# Patient Record
Sex: Female | Born: 1945 | Race: White | Hispanic: No | Marital: Single | Smoking: Never smoker
Health system: Southern US, Community
[De-identification: ages and names within clinical notes are randomized; demographics above are authoritative.]

## PROBLEM LIST (undated history)

## (undated) DIAGNOSIS — I10 Essential (primary) hypertension: Secondary | ICD-10-CM

## (undated) DIAGNOSIS — Z8739 Personal history of other diseases of the musculoskeletal system and connective tissue: Secondary | ICD-10-CM

## (undated) DIAGNOSIS — E119 Type 2 diabetes mellitus without complications: Secondary | ICD-10-CM

## (undated) DIAGNOSIS — E7800 Pure hypercholesterolemia, unspecified: Secondary | ICD-10-CM

## (undated) HISTORY — PX: APPENDECTOMY: SHX54

## (undated) HISTORY — PX: JOINT REPLACEMENT: SHX530

## (undated) HISTORY — PX: ABDOMINAL HYSTERECTOMY: SHX81

## (undated) HISTORY — PX: OTHER SURGICAL HISTORY: SHX169

## (undated) HISTORY — PX: EYE SURGERY: SHX253

---

## 2014-07-24 DIAGNOSIS — F4322 Adjustment disorder with anxiety: Secondary | ICD-10-CM | POA: Insufficient documentation

## 2014-07-24 DIAGNOSIS — N189 Chronic kidney disease, unspecified: Secondary | ICD-10-CM | POA: Insufficient documentation

## 2014-07-24 DIAGNOSIS — K589 Irritable bowel syndrome without diarrhea: Secondary | ICD-10-CM

## 2014-07-24 DIAGNOSIS — M159 Polyosteoarthritis, unspecified: Secondary | ICD-10-CM

## 2014-07-24 DIAGNOSIS — E039 Hypothyroidism, unspecified: Secondary | ICD-10-CM | POA: Insufficient documentation

## 2014-07-24 DIAGNOSIS — I10 Essential (primary) hypertension: Secondary | ICD-10-CM | POA: Insufficient documentation

## 2014-07-24 HISTORY — DX: Hypothyroidism, unspecified: E03.9

## 2014-07-24 HISTORY — DX: Irritable bowel syndrome, unspecified: K58.9

## 2014-07-24 HISTORY — DX: Adjustment disorder with anxiety: F43.22

## 2014-07-24 HISTORY — DX: Polyosteoarthritis, unspecified: M15.9

## 2014-07-24 HISTORY — DX: Chronic kidney disease, unspecified: N18.9

## 2015-01-23 DIAGNOSIS — E782 Mixed hyperlipidemia: Secondary | ICD-10-CM

## 2015-01-23 HISTORY — DX: Mixed hyperlipidemia: E78.2

## 2015-07-24 DIAGNOSIS — N183 Chronic kidney disease, stage 3 unspecified: Secondary | ICD-10-CM | POA: Insufficient documentation

## 2015-07-24 DIAGNOSIS — E1122 Type 2 diabetes mellitus with diabetic chronic kidney disease: Secondary | ICD-10-CM

## 2015-07-24 HISTORY — DX: Type 2 diabetes mellitus with diabetic chronic kidney disease: E11.22

## 2015-07-24 HISTORY — DX: Chronic kidney disease, stage 3 unspecified: N18.30

## 2016-07-26 DIAGNOSIS — M6740 Ganglion, unspecified site: Secondary | ICD-10-CM | POA: Insufficient documentation

## 2016-07-26 DIAGNOSIS — E042 Nontoxic multinodular goiter: Secondary | ICD-10-CM | POA: Insufficient documentation

## 2016-07-26 HISTORY — DX: Ganglion, unspecified site: M67.40

## 2016-07-26 HISTORY — DX: Nontoxic multinodular goiter: E04.2

## 2017-07-02 ENCOUNTER — Emergency Department (HOSPITAL_BASED_OUTPATIENT_CLINIC_OR_DEPARTMENT_OTHER): Payer: Medicare Other

## 2017-07-02 ENCOUNTER — Emergency Department (HOSPITAL_BASED_OUTPATIENT_CLINIC_OR_DEPARTMENT_OTHER)
Admission: EM | Admit: 2017-07-02 | Discharge: 2017-07-02 | Disposition: A | Discharge status: Home / Self Care | Payer: Medicare Other | Attending: Emergency Medicine | Admitting: Emergency Medicine

## 2017-07-02 ENCOUNTER — Encounter (HOSPITAL_BASED_OUTPATIENT_CLINIC_OR_DEPARTMENT_OTHER): Payer: Self-pay | Admitting: *Deleted

## 2017-07-02 DIAGNOSIS — E119 Type 2 diabetes mellitus without complications: Secondary | ICD-10-CM | POA: Insufficient documentation

## 2017-07-02 DIAGNOSIS — I10 Essential (primary) hypertension: Secondary | ICD-10-CM | POA: Diagnosis not present

## 2017-07-02 DIAGNOSIS — M549 Dorsalgia, unspecified: Secondary | ICD-10-CM

## 2017-07-02 DIAGNOSIS — M546 Pain in thoracic spine: Secondary | ICD-10-CM

## 2017-07-02 HISTORY — DX: Personal history of other diseases of the musculoskeletal system and connective tissue: Z87.39

## 2017-07-02 HISTORY — DX: Pure hypercholesterolemia, unspecified: E78.00

## 2017-07-02 HISTORY — DX: Essential (primary) hypertension: I10

## 2017-07-02 HISTORY — DX: Type 2 diabetes mellitus without complications: E11.9

## 2017-07-02 MED ORDER — LIDO-CAPSAICIN-MEN-METHYL SAL 4-0.0375-5-20 % EX PTCH: 1.0000 | MEDICATED_PATCH | CUTANEOUS | 0 refills | Status: DC

## 2017-07-02 MED ORDER — LIDOCAINE 5 % EX PTCH
1.0000 | MEDICATED_PATCH | CUTANEOUS | Status: DC
  Filled 2017-07-02: qty 1

## 2017-07-02 MED ORDER — OXYCODONE-ACETAMINOPHEN 5-325 MG PO TABS: 1.0000 | ORAL_TABLET | Freq: Four times a day (QID) | ORAL | 0 refills | Status: DC | PRN

## 2017-07-02 NOTE — ED Provider Notes (Signed)
[facility] Provider Note   CSN: [id number] Arrival date & time: 07/02/17  1002     History   Chief Complaint Chief Complaint  Patient presents with  . Back Pain    chronic    HPI Jenny Thompson is a tearful 71 y.o. female with a h/o of DDD and DM who presents to the emergency department with constant, worsening, non-radiating mid-back pain that she describes as achy and sharp. She reports a history of chronic back pain for which she sees a chiropractor once a month for maintenance. She states that typically after visit that she is pain-free; however, she saw her chiropractor 3 days ago with no relief. She reports associated intermittent nausea. Denies fever, chills, numbness, weakness, or tingling. She has treated her symptoms at home with ibuprofen and Excedrin, which will provide some relief for a short amount of time before the pain returns. She reports that she has been unable to sleep through the night due to the pain. She denies chest pain, dyspnea, abdominal pain, or emesis. No new falls or injuries.  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   The history is provided by the patient. No language interpreter was used.    Past Medical History:  Diagnosis Date  . Diabetes mellitus without complication (HCC)   . Hx of degenerative disc disease   . Hypercholesteremia   . Hypertension     There are no active problems to display for this patient.   Past Surgical History:  Procedure Laterality Date  . ABDOMINAL HYSTERECTOMY    . APPENDECTOMY    . arm fracture surgery Left   . EYE SURGERY    . JOINT REPLACEMENT      OB History    No data available       Home Medications    Prior to Admission medications   Medication Sig Start Date End Date Taking? Authorizing Provider  alendronate (FOSAMAX) 70 MG tablet Take 70 mg by mouth once a week. Take with a full glass of water on an empty stomach.   Yes [provider]    aspirin EC 81 MG tablet Take 81 mg by mouth daily.   Yes [provider]  buPROPion (ZYBAN) 150 MG 12 hr tablet Take 150 mg by mouth 2 (two) times daily.   Yes [provider]  calcium-vitamin D 250-100 MG-UNIT tablet Take 1 tablet by mouth 2 (two) times daily.   Yes [provider]  cholecalciferol (VITAMIN D) 1000 units tablet Take 1,000 Units by mouth daily.   Yes [provider]  clidinium-chlordiazePOXIDE (LIBRAX) 5-2.5 MG capsule Take 1 capsule by mouth as needed.   Yes [provider]  hydrochlorothiazide (HYDRODIURIL) 25 MG tablet Take 25 mg by mouth daily.   Yes [provider]  metFORMIN (GLUCOPHAGE) 500 MG tablet Take 500 mg by mouth 2 (two) times daily with a meal.   Yes [provider]  methocarbamol (ROBAXIN) 750 MG tablet Take 750 mg by mouth as needed for muscle spasms.   Yes [provider]  Multiple Vitamin (MULTIVITAMIN) tablet Take 1 tablet by mouth daily.   Yes [provider]  ramipril (ALTACE) 10 MG capsule Take 10 mg by mouth daily.   Yes [provider]  rosuvastatin (CRESTOR) 20 MG tablet Take 20 mg by mouth daily.   Yes [provider]  Lido-Capsaicin-Men-Methyl Sal (1ST MEDX-PATCH/ LIDOCAINE) 4-0.374-05-08 % [facility] Apply 1 patch topically daily. 07/02/17  Dawanda Mapel A, PA-C  oxyCODONE-acetaminophen (PERCOCET/ROXICET) 5-325 MG tablet Take 1 tablet by mouth every 6 (six) hours as needed for severe pain. 07/02/17   Yaretzy Olazabal A, PA-C    Family History No family history on file.  Social History Social History  Substance Use Topics  . Smoking status: Never Smoker  . Smokeless tobacco: Never Used  . Alcohol use No     Allergies   Prednisone   Review of Systems Review of Systems  Constitutional: Negative for chills and fever.  Respiratory: Negative for shortness of breath.   Cardiovascular: Negative for chest pain.  Gastrointestinal: Positive for nausea.  Negative for abdominal pain, diarrhea and vomiting.  Musculoskeletal: Positive for back pain and myalgias. Negative for gait problem, joint swelling, neck pain and neck stiffness.  Skin: Negative for rash and wound.  Neurological: Negative for dizziness, weakness, light-headedness, numbness and headaches.     Physical Exam Updated Vital Signs BP 135/63 (BP Location: Right Arm)   Pulse 87   Temp 97.9 F (36.6 C) (Oral)   Resp 18   Ht 5\' 1"  (1.549 m)   Wt 80.3 kg (177 lb)   SpO2 97%   BMI 33.44 kg/m   Physical Exam  Constitutional: No distress.  HENT:  Head: Normocephalic.  Eyes: Conjunctivae are normal.  Neck: Neck supple.  Cardiovascular: Normal rate, regular rhythm and intact distal pulses.  Exam reveals no gallop and no friction rub.   Murmur heard. Pulmonary/Chest: Effort normal and breath sounds normal. No respiratory distress. She has no wheezes. She has no rales.  Abdominal: Soft. She exhibits no distension. There is no tenderness.  Musculoskeletal:  Tenderness to palpation over the bony midline of the thoracic spine with mild surrounding tenderness to the left paraspinal muscles of the thoracic spine. No right-sided paraspinal muscle tenderness. No cervical or lumbar tenderness to the spinous processes or the surrounding paraspinal muscles. Radial pulses 2+ bilaterally. 5 out of 5 grip strength of the bilateral upper extremities. Sensation is intact throughout.  Neurological: She is alert.  Skin: Skin is warm. No rash noted.  Psychiatric: Her behavior is normal.  Nursing note and vitals reviewed.    ED Treatments / Results  Labs (all labs ordered are listed, but only abnormal results are displayed) Labs Reviewed - No data to display  EKG  EKG Interpretation None       Radiology Dg Thoracic Spine W/swimmers  Result Date: 07/02/2017 CLINICAL DATA:  Chronic upper thoracic back pain EXAM: THORACIC SPINE - 3 VIEWS COMPARISON:  None available FINDINGS: Severe  degenerative spondylosis of the thoracic spine at all levels with disc space narrowing, sclerosis and endplate osteophytes. Normal alignment. No acute compression fracture, wedge-shaped deformity or focal kyphosis. Normal paraspinal soft tissues. Atherosclerosis of aorta. Trachea is midline. Right hemidiaphragm is elevated. 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 No acute finding by plain radiography Electronically Signed   By: Judie Petit.  Shick M.D.   On: 07/02/2017 11:12    Procedures Procedures (including critical care time)  Medications Ordered in ED Medications - No data to display   Initial Impression / Assessment and Plan / ED Course  I have reviewed the triage vital signs and the nursing notes.  Pertinent labs & imaging results that were available during my care of the patient were reviewed by me and considered in my medical decision making (see chart for details).     71 year old female with a history of avascular necrosis of the left shoulder  and DDD presenting with acute on chronic atraumatic tenderness to palpation of the thoracic spine. The patient was seen and evaluated with Dr. Rush Landmarkegeler, attending physician. Imaging reveals severe multilevel degenerative spondylosis of the thoracic spine. No acute findings. Normal neuro exam without focal deficits. Will discharge the patient with lidocaine patches and a short course of pain medication and follow-up to her PCP. A 7745-month prescription history query was performed using the [place] CSRS prior to discharge. Strict return precautions given. No acute distress. The patient is safe and stable for discharge at this time.  Final Clinical Impressions(s) / ED Diagnoses   Final diagnoses:  Back pain  Acute midline thoracic back pain    New Prescriptions New Prescriptions   LIDO-CAPSAICIN-MEN-METHYL SAL (1ST MEDX-PATCH/ LIDOCAINE) 4-0.374-05-08 % [facility]    Apply 1 patch topically daily.   OXYCODONE-ACETAMINOPHEN  (PERCOCET/ROXICET) 5-325 MG TABLET    Take 1 tablet by mouth every 6 (six) hours as needed for severe pain.     Barkley BoardsMcDonald, Zaia Carre A, PA-C 07/02/17 1202    Tegeler, Canary Brimhristopher J, MD 07/05/17 1024

## 2017-07-02 NOTE — ED Triage Notes (Signed)
Pt reports chronic neck and upper back pain. States she's seen regularly by a chiropractor (last visit this past Thursday). Reports unrelieved pain; reports no pain meds PTA. Pt denies chest pain, sob, incontinence of bowel/bladder.

## 2017-07-02 NOTE — Discharge Instructions (Signed)
Please follow-up with your primary care provider if symptoms persist. If you develop new or worsening symptoms including fever, chills, numbness, weakness, or other new severe symptoms, please return to the emergency department for reevaluation.

## 2017-07-02 NOTE — ED Notes (Signed)
ED Provider at bedside. 

## 2017-07-27 DIAGNOSIS — M7502 Adhesive capsulitis of left shoulder: Secondary | ICD-10-CM | POA: Insufficient documentation

## 2017-07-27 DIAGNOSIS — M47814 Spondylosis without myelopathy or radiculopathy, thoracic region: Secondary | ICD-10-CM

## 2017-07-27 HISTORY — DX: Spondylosis without myelopathy or radiculopathy, thoracic region: M47.814

## 2017-07-27 HISTORY — DX: Adhesive capsulitis of left shoulder: M75.02

## 2018-10-27 ENCOUNTER — Ambulatory Visit (INDEPENDENT_AMBULATORY_CARE_PROVIDER_SITE_OTHER): Payer: Medicare Other | Admitting: Psychiatry

## 2018-10-27 DIAGNOSIS — Z8659 Personal history of other mental and behavioral disorders: Secondary | ICD-10-CM | POA: Insufficient documentation

## 2018-10-27 DIAGNOSIS — F4322 Adjustment disorder with anxiety: Secondary | ICD-10-CM

## 2018-10-27 HISTORY — DX: Personal history of other mental and behavioral disorders: Z86.59
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
## 2019-01-01 ENCOUNTER — Ambulatory Visit (INDEPENDENT_AMBULATORY_CARE_PROVIDER_SITE_OTHER): Payer: Medicare Other | Admitting: Psychiatry

## 2019-01-01 DIAGNOSIS — Z8659 Personal history of other mental and behavioral disorders: Secondary | ICD-10-CM

## 2019-01-01 DIAGNOSIS — F4322 Adjustment disorder with anxiety: Secondary | ICD-10-CM

## 2019-01-01 NOTE — Progress Notes (Addendum)
Psychotherapy Progress Note [facility]  Patient ID: Jenny Thompson     MRN: [id number]      Date:      Start: 1:19p Stop: 1:10p Time Spent: 51 min Therapy format: Individual psychotherapy  Session narrative -- presenting needs, interim history, self-report of stressors and symptoms, applications of prior therapy, status changes, and interventions in session Frustrations and sympathetic grief for sister dealing with Jenny Thompson's dementia.  Social dynamics can be tense visiting them, and medical testing for him has been particularly frustrating.  Off-putting visit from virtual niece Jenny Thompson, who seemed entitled/expecting attention, reactionary, and drove Jenny Thompson to tears twice in the first hour.  Jenny Thompson's driving nerve-wracking, and Jenny Thompson's attention soft about navigating, setting him up for sudden reactions of his own.  Had practice not giving in for another of Jenny Thompson's self-serving plans (would have compromised travel).  Also had back trouble flare-up, enough to need out-of-town chiropractor.  Big decision to make about annual Jenny Thompson trip with sister for her birthday, knowing Jenny Thompson is increasingly unable to handle himself by himself.  (ADLs deteriorated enough now that he has not showered or shaved in 2 weeks, and chronically wets himself.)    Sees Jenny Thompson talking about symptoms but not doing anything significant.  Consulted about how to approach Jenny Thompson with worries -- largely, ask for time, present the discrepancy between what she says and does, ask if she sees it too, then ask if she wants it different.  Then would be the opportunity to go into solutions, but the first thing is perception and motivation.  Agrees.  Therapeutic modalities: Cognitive Behavioral Therapy, Assertiveness/Communication and Solution-Oriented/Positive Psychology  Mental Status/Observations:  Appearance:   Casual     Behavior:  Appropriate  Motor:  Normal  Speech/Language:   Clear and Coherent  Affect:  Appropriate  Mood:   appropriate to situation  Thought process:  normal  Thought content:    WNL  Sensory/Perceptual disturbances:    WNL  Orientation:  WNL  Attention:  Good  Concentration:  Good  Memory:  WNL  Insight:    Good  Judgment:   Good  Impulse Control:  Good   Risk Assessment: Danger to Self:  No Self-injurious Behavior: No Danger to Others: No Duty to Warn:no Physical Aggression / Violence:No  Access to Firearms a concern: No   Diagnosis:   ICD-10-CM   1. 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 Continued freedom to assert and supportively inquire with sister . Use tips on broaching the caring conversation ad lib . Continue to utilize previously learned skills ad lib . Maintain medication, if prescribed, and work faithfully with relevant prescriber(s) . Call the clinic on-call service, present to ER, or call 911 if any life-threatening emergency . Follow up with me in about 2 months or as desired if things change    Robley Fries, PhD [place] Licensed Psychologist

## 2019-02-14 ENCOUNTER — Ambulatory Visit: Payer: Medicare Other | Admitting: Psychiatry

## 2019-02-28 ENCOUNTER — Ambulatory Visit (INDEPENDENT_AMBULATORY_CARE_PROVIDER_SITE_OTHER): Payer: Medicare Other | Admitting: Psychiatry

## 2019-02-28 ENCOUNTER — Other Ambulatory Visit: Payer: Self-pay

## 2019-02-28 DIAGNOSIS — F4322 Adjustment disorder with anxiety: Secondary | ICD-10-CM

## 2019-02-28 DIAGNOSIS — E119 Type 2 diabetes mellitus without complications: Secondary | ICD-10-CM | POA: Diagnosis not present

## 2019-02-28 DIAGNOSIS — Z8659 Personal history of other mental and behavioral disorders: Secondary | ICD-10-CM

## 2019-02-28 NOTE — Progress Notes (Signed)
Psychotherapy Progress Note [facility] 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  Tapering bupropion and tapering on Cymbalta, having been sleeping badly since Xmas (initial insomnia).  Has tried the amber glasses, using over a year, and has sleep mask.  Gabapentin for pain was not reliable.  Now sleeping soundly with Cymbalta, successfully dosed at night.  Also Flonase at night, to treat bedtime coughing that also kept her up.  Air cleaner runs continuously in bedroom.  Glad to have better sleep, all else is easier with it.  Saw [name] and [name], in [place], for her birthday, including [name], without incident.  No interventions made for the Xmas incident, seems to just be an attitude turned without intervening.  Just turned out that merely accepting her, without crossing her, probably calmed her rejection issue.  PT and sister did realize Xmas that [name] needed support s/p seeing her homophobic parents.  Re. worries for Ria Comment is doing all driving now for husband with dementia.  Glad to have helped her get out from under the daily pressure and glad to have more immediate time with her.  Less feeling that PT is swallowing things she wants to say than just exercising discretion.  [name] took suggestion from PT to have husband write down priority things for memory's sake.  PT's anxiety about sister's situation down a great deal, more routine now not  to have to fix it, just hear it.  Feels now she has conquered the impulse, able to let it be a feeling.    Otherwise, pleased with housekeeping, much less cluttered and procrastinated than before.   Discussed outlook for therapy -- not ready emotionally to call off sessions, still wants some confidence boost, but OK with less frequent, e.g., q 110mo.  Agreed.  Therapeutic modalities: Solution-Oriented/Positive Psychology and Ego-Supportive  Mental Status/Observations:  Appearance:   Casual     Behavior:  Appropriate  Motor:  Normal  Speech/Language:   Clear and Coherent  Affect:  Appropriate and pleasant  Mood:  euthymic  Thought process:  normal  Thought content:    WNL  Sensory/Perceptual disturbances:    WNL  Orientation:  WNL  Attention:  Good  Concentration:  Good  Memory:  WNL  Insight:    Good  Judgment:   Good  Impulse Control:  Good   Risk Assessment: Danger to Self:  No Self-injurious Behavior: No Danger to Others: No Duty to Warn:no Physical Aggression / Violence:No  Access to Firearms a concern: No   Diagnosis:   ICD-10-CM   1. 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 Type 2 diabetes mellitus without complication, unspecified whether long term insulin use (HCC) E11.9     Assessment of progress:  improving  Plan:  . Continue serene approach to sister's troubles and quickness to let go impulses to fix . Continue to apply humane treatment of self  and others  . Continue positive medical care  . Continue to utilize previously learned skills ad lib . Maintain medication as prescribed and work faithfully with relevant prescriber(s) if any changes are desired or seem indicated . Call the clinic on-call service, present to ER, or call 911 if any life-threatening emergency Return in about 2 months (around 04/30/2019).   Robley Fries, PhD [place] Licensed Psychologist

## 2019-05-01 ENCOUNTER — Ambulatory Visit: Payer: Medicare Other | Admitting: Psychiatry

## 2021-10-07 ENCOUNTER — Ambulatory Visit (INDEPENDENT_AMBULATORY_CARE_PROVIDER_SITE_OTHER): Payer: Medicare Other | Admitting: Psychiatry

## 2021-10-07 ENCOUNTER — Other Ambulatory Visit: Payer: Self-pay

## 2021-10-07 DIAGNOSIS — F4323 Adjustment disorder with mixed anxiety and depressed mood: Secondary | ICD-10-CM
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
## 2021-11-18 ENCOUNTER — Ambulatory Visit (INDEPENDENT_AMBULATORY_CARE_PROVIDER_SITE_OTHER): Payer: Medicare Other | Admitting: Psychiatry

## 2021-11-18 ENCOUNTER — Other Ambulatory Visit: Payer: Self-pay

## 2021-11-18 DIAGNOSIS — Z636 Dependent relative needing care at home: Secondary | ICD-10-CM

## 2021-11-18 DIAGNOSIS — F341 Dysthymic disorder: Secondary | ICD-10-CM | POA: Diagnosis not present

## 2021-11-18 NOTE — Progress Notes (Signed)
Psychotherapy Progress Note [facility] Marliss Czar, PhD LP  Patient ID: Kaydynce Pat [facility])    MRN: [id number] Therapy format: Individual psychotherapy Date: 11/18/2021      Start: 9:13a     Stop: 10:03a     Time Spent: 50 min Location: In-person   Session narrative (presenting needs, interim history, self-report of stressors and symptoms, applications of prior therapy, status changes, and interventions made in session) Approaching annual holiday trip to [place] for [name] BD and Xmas.  Apprehensions facing things that may be painful there, including seeing sister's needs/suffering up close again, tom's continuing decline into dementia with [name] as almost sole caregiver, and the distasteful scenario of [name] sister Gunnar Fusi getting taken advantage of by a scamming caregiver and how it works on [place].    More than that, wants to deal with her tendency to isolate.  Mostly doesn't want to see people and is comfortable with that.  Continued suggestion she has seen enough hurtful happen in life, or at least enough draining by others, remains leery of getting too involved absent a strong trust or family duty.    Still indulges long phone calls from [place] on a regular basis, largely listening 2 hours to obsessing about things.  Incrementally maybe getting her to reach out for other supports where she is.    More aware of her own mortality lately.  Clear she has made it living after losing her parents, when she used to think she couldn't abide it.  10 years of a nagging cough variously attributed to postnasal drip or GERD, but sparse findings on endoscopy.  Has had esophageal stretch and thyroid biopsy (long hx of nodules).  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  Some finding on ECG that she has dropped beats, and she has had some murmur already.  Mostly has done well taking care of diabetes, HTN.  Worries for [name] with her back  condition, sometimes dysfunctional need for privacy, tendency to lean only on Evalynn, slow adoption of in-home help for [name].    Supportively discussed tactics she can use and the affirmative, compassionate rationale for nudging her to try out other supports more.  Modeled assertiveness and matter-of-factness in acknowledging both her own interests (e.g., some time back, protection from compassion/fatigue) and [name] (head off a painful collision with reality if Shandra dies first).  Affirmed plenty of good and understandable reasons, nothing immoral about wanting those things.  Aside from so much life tied up with [name] and her situations, remains involved with her church, although the current upheaval in the [facility] is directly affecting her, as she is in the distinct minority who did not want to disaffiliate (roughly 6:1 vote to leave).  Therapeutic modalities: Cognitive Behavioral Therapy, Solution-Oriented/Positive Psychology, Ego-Supportive, and Humanistic/Existential  Mental Status/Observations:  Appearance:   Casual     Behavior:  Appropriate  Motor:  Normal  Speech/Language:   Clear and Coherent  Affect:  Appropriate  Mood:  dysthymic  Thought process:  normal  Thought content:    WNL  Sensory/Perceptual disturbances:    WNL  Orientation:  Fully oriented  Attention:  Good    Concentration:  Good  Memory:  WNL  Insight:    Good  Judgment:   Good  Impulse Control:  Good   Risk Assessment: Danger to Self: No Self-injurious Behavior: No Danger to Others: No Physical Aggression / Violence: No Duty to Warn: No Access to  Firearms a concern: No  Assessment of progress:  progressing  Diagnosis:   ICD-10-CM   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 Caregiver stress  Z63.6      Plan:  Self-affirm good reasons and mutual interest lie behind efforts to nudge [name] to connect to other resources, be willing to hire services even at some cost to privacy and appearances, and to spread out her  dependency Continue to apply free choice to service rendered -- no "have tos", only "choose tos" for good enough reasons Lead, where needed, in acknowledging existential reality that neither of them have forever, and how deeper love looks ahead and starts adjusting to change before it drops abruptly Other recommendations/advice as may be noted above Continue to utilize previously learned skills ad lib Maintain medication as prescribed and work faithfully with relevant prescriber(s) if any changes are desired or seem indicated Call the clinic on-call service, 988/hotline, 911, or present to [facility] or ER if any life-threatening psychiatric crisis Return for time at discretion. Already scheduled visit in this office Visit date not found.  Robley Fries, PhD Marliss Czar, PhD LP Clinical Psychologist, [facility] [address] [place], [place] [address] [phone]

## 2022-01-08 ENCOUNTER — Ambulatory Visit (INDEPENDENT_AMBULATORY_CARE_PROVIDER_SITE_OTHER): Payer: Medicare Other | Admitting: Psychiatry

## 2022-01-08 ENCOUNTER — Other Ambulatory Visit: Payer: Self-pay

## 2022-01-08 DIAGNOSIS — Z636 Dependent relative needing care at home: Secondary | ICD-10-CM | POA: Diagnosis not present

## 2022-01-08 DIAGNOSIS — F4323 Adjustment disorder with mixed anxiety and depressed mood: Secondary | ICD-10-CM

## 2022-01-08 DIAGNOSIS — F341 Dysthymic disorder: Secondary | ICD-10-CM

## 2022-01-08 NOTE — Progress Notes (Unsigned)
Psychotherapy Progress Note [facility] Marliss Czar, PhD LP  Patient ID: Jenny Thompson [facility])    MRN: [id number] Therapy format: {Therapy Types:21967::"Individual psychotherapy"} Date: 01/08/2022      Start: ***:***     Stop: ***:***     Time Spent: *** min Location: {SvcLoc:22530::"In-person"}   Session narrative (presenting needs, interim history, self-report of stressors and symptoms, applications of prior therapy, status changes, and interventions made in session) Went to [facility] for 3 weeks, stayed 6.  Jenny Thompson had back procedure.  Seeing Jenny Thompson succumb to a career now of continuous drinking and smoking nightly, "nightmare" seeing her mistake directions and be so compulsive.  Prep to deal with irritating family turned out not to be needed, as they begged off, but she was centered enough not to consider herself controlled by their manipulation.  Hard to see Jenny Thompson's (BIL) dementia deepening.  Jenny Thompson will most likely have further surgery in several weeks, which will requite 3-4 weeks off duty waiting on Jenny Thompson.    Physically, more active in [place], keeping it up home.    Therapeutic modalities: {AM:23362::"Cognitive Behavioral Therapy","Solution-Oriented/Positive Psychology"}  Mental Status/Observations:  Appearance:   {PSY:22683}     Behavior:  {PSY:21022743}  Motor:  {PSY:22302}  Speech/Language:   {PSY:22685}  Affect:  {PSY:22687}  Mood:  {PSY:31886}  Thought process:  {PSY:31888}  Thought content:    {PSY:469 477 5470}  Sensory/Perceptual disturbances:    {PSY:313-545-7224}  Orientation:  {Psych Orientation:23301::"Fully oriented"}  Attention:  {Good-Fair-Poor ratings:23770::"Good"}    Concentration:  {Good-Fair-Poor ratings:23770::"Good"}  Memory:  {PSY:401 034 5201}  Insight:    {Good-Fair-Poor ratings:23770::"Good"}  Judgment:   {Good-Fair-Poor ratings:23770::"Good"}  Impulse Control:  {Good-Fair-Poor ratings:23770::"Good"}   Risk Assessment: Danger to Self:  {Risk:22599::"No"} Self-injurious Behavior: {Risk:22599::"No"} Danger to Others: {Risk:22599::"No"} Physical Aggression / Violence: {Risk:22599::"No"} Duty to Warn: {AMYesNo:22526::"No"} Access to Firearms a concern: {AMYesNo:22526::"No"}  Assessment of progress:  {Progress:22147::"progressing"}  Diagnosis: No diagnosis found. Plan:  *** Other recommendations/advice as may be noted above Continue to utilize previously learned skills ad lib Maintain medication as prescribed and work faithfully with relevant prescriber(s) if any changes are desired or seem indicated Call the clinic on-call service, 988/hotline, 911, or present to [facility] or ER if any life-threatening psychiatric crisis No follow-ups on file. Already scheduled visit in this office 02/08/2022.  Robley Fries, PhD Marliss Czar, PhD LP Clinical Psychologist, [facility] [address] [place], [place] [address] [phone]

## 2022-02-08 ENCOUNTER — Ambulatory Visit: Payer: Medicare Other | Admitting: Psychiatry

## 2022-03-11 ENCOUNTER — Other Ambulatory Visit: Payer: Self-pay

## 2022-03-11 ENCOUNTER — Ambulatory Visit (INDEPENDENT_AMBULATORY_CARE_PROVIDER_SITE_OTHER): Payer: Medicare Other | Admitting: Psychiatry

## 2022-03-11 DIAGNOSIS — F4323 Adjustment disorder with mixed anxiety and depressed mood: Secondary | ICD-10-CM | POA: Diagnosis not present

## 2022-03-11 DIAGNOSIS — Z636 Dependent relative needing care at home: Secondary | ICD-10-CM | POA: Diagnosis not present

## 2022-03-11 DIAGNOSIS — F341 Dysthymic disorder: Secondary | ICD-10-CM
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
## 2022-04-07 ENCOUNTER — Ambulatory Visit (INDEPENDENT_AMBULATORY_CARE_PROVIDER_SITE_OTHER): Payer: Medicare Other | Admitting: Psychiatry

## 2022-04-07 DIAGNOSIS — F341 Dysthymic disorder: Secondary | ICD-10-CM | POA: Diagnosis not present

## 2022-04-07 DIAGNOSIS — Z636 Dependent relative needing care at home: Secondary | ICD-10-CM

## 2022-04-07 DIAGNOSIS — F4323 Adjustment disorder with mixed anxiety and depressed mood: Secondary | ICD-10-CM

## 2022-04-07 NOTE — Progress Notes (Signed)
Psychotherapy Progress Note [facility] Luan Moore, PhD LP  Patient ID: Jenny Thompson [facility])    MRN: [id number] Therapy format: Individual psychotherapy Date: 04/07/2022      Start: 2:20p     Stop: 3:08p     Time Spent: 48 min Location: In-person   Session narrative (presenting needs, interim history, self-report of stressors and symptoms, applications of prior therapy, status changes, and interventions made in session) Headed to [facility] to help sister, who has now deferred surgery.  Still bothered by her unhealthy boundaries and unassertiveness with the family of her housekeeper, who are also supposedly buying the property in a seller-financed arrangement Akeria feels is poor judgment.  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  Figuring out now to inform other trusted people about her final wishes.  A true joy for Nica to have adopted neighbor family, highly trusted, and be regarded as a grandmother.  Affirmed local connections and discussed gently assertive tactics for issues with [name], including when she complains repetitively about things she won't do anything about.  Still takes nightly calls from [place] after she's been drinking into the evening.  Therapeutic modalities: Cognitive Behavioral Therapy, Solution-Oriented/Positive Psychology, and Ego-Supportive  Mental Status/Observations:  Appearance:   Casual     Behavior:  Appropriate  Motor:  Normal  Speech/Language:   Clear and Coherent  Affect:  Appropriate  Mood:  concerned  Thought process:  normal  Thought content:    WNL  Sensory/Perceptual disturbances:    WNL  Orientation:  Fully oriented  Attention:  Good    Concentration:  Good  Memory:  WNL  Insight:    Good  Judgment:   Good  Impulse Control:  Good   Risk Assessment: Danger to Self: No Self-injurious Behavior: No Danger to Others: No Physical Aggression / Violence: No Duty to Warn: No Access to Firearms a  concern: No  Assessment of progress:  progressing  Diagnosis:   ICD-10-CM   1. Adjustment disorder with mixed anxiety and depressed mood  F43.23     2. Caregiver stress  Z63.6     3. 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  Can recommend B complex to offset some of the damage, and supportively inquire after her self-care, including counseling. Self-affirm full right to decide which burdens to take on, both while in [place] and on nightly calls While visiting, full authority to enforce privacy  At discretion, may call in local Adult Protective Services at any time if she feels either [name] or [name] welfare are at risk Maintain helpful social support at home, including as desired neighbors, church, others Other recommendations/advice as may be noted above Continue to utilize previously learned skills ad lib Maintain medication as prescribed and work faithfully with relevant prescriber(s) if any changes are desired or seem indicated Call the clinic on-call service, 988/hotline, 911, or present to [facility] or ER if any life-threatening psychiatric crisis No follow-ups on file. Already scheduled visit in this office 05/07/2022.  [name], PhD Luan Moore, PhD LP Clinical Psychologist, [facility] [address], [place], [place] [address] [phone]

## 2022-05-07 ENCOUNTER — Ambulatory Visit (INDEPENDENT_AMBULATORY_CARE_PROVIDER_SITE_OTHER): Payer: Medicare Other | Admitting: Psychiatry

## 2022-05-07 DIAGNOSIS — F341 Dysthymic disorder: Secondary | ICD-10-CM

## 2022-05-07 DIAGNOSIS — Z636 Dependent relative needing care at home: Secondary | ICD-10-CM | POA: Diagnosis not present

## 2022-05-07 DIAGNOSIS — F4323 Adjustment disorder with mixed anxiety and depressed mood: Secondary | ICD-10-CM | POA: Diagnosis not present

## 2022-05-07 NOTE — Progress Notes (Signed)
Psychotherapy Progress Note [facility] Marliss Czar, PhD LP  Patient ID: Jenny Thompson [facility])    MRN: [id number] Therapy format: Individual psychotherapy Date: 05/07/2022      Start: 2:20p     Stop: 3:05p     Time Spent: 45 min Location: In-person   Session narrative (presenting needs, interim history, self-report of stressors and symptoms, applications of prior therapy, status changes, and interventions made in session) Back from [name], successfully got through pictures and cleaned out the room/structure where Annakate usually stays, in prep for selling the house.  Serendipitous contact with [name] first H and opportunity to share pictures.  Yet to deal with [name] getting back surgery, more likely will have nerve ablation June.  Meanwhile, continuing concerns for [name] nightly intoxication and daily all-or-none, workaholic ways, figures she's becoming better known in her small town as alcoholic.  Can't get her to reckon with alcoholism, but did order her B complex and got her to take it.  Affirmed and encouraged.  Further discussion of [name] handling of H's dementia and boundaries with housekeeper/buyer family.  Therapeutic modalities: Cognitive Behavioral Therapy, Solution-Oriented/Positive Psychology, and Ego-Supportive  Mental Status/Observations:  Appearance:   Casual     Behavior:  Appropriate  Motor:  Normal  Speech/Language:   Clear and Coherent  Affect:  Appropriate  Mood:  concerned  Thought process:  normal  Thought content:    WNL  Sensory/Perceptual disturbances:    WNL  Orientation:  Fully oriented  Attention:  Good    Concentration:  Good  Memory:  WNL  Insight:    Good  Judgment:   Good  Impulse Control:  Good   Risk Assessment: Danger to Self: No Self-injurious Behavior: No Danger to Others: No Physical Aggression / Violence: No Duty to Warn: No Access to Firearms a concern: No  Assessment of progress:  progressing  Diagnosis:    ICD-10-CM   1. Adjustment disorder with mixed anxiety and depressed mood  F43.23     2. 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  Can recommend B complex to offset some of the damage, and supportively inquire after her self-care, including counseling. Self-affirm full right to decide which burdens to take on, both while in [place] and on nightly calls While visiting, full authority to enforce privacy  At discretion, may call in local Adult Protective Services at any time if she feels either [name] or [name] welfare are at risk Maintain helpful social support at home, including as desired neighbors, church, others Other recommendations/advice as may be noted above Continue to utilize previously learned skills ad lib Maintain medication as prescribed and work faithfully with relevant prescriber(s) if any changes are desired or seem indicated Call the clinic on-call service, 988/hotline, 911, or present to [facility] or ER if any life-threatening psychiatric crisis Return for time as available. Already scheduled visit in this office 06/08/2022.  Robley Fries, PhD Marliss Czar, PhD LP Clinical Psychologist, [facility] [address] [place], [place] [address] [phone]

## 2022-06-05 ENCOUNTER — Emergency Department (HOSPITAL_BASED_OUTPATIENT_CLINIC_OR_DEPARTMENT_OTHER): Payer: Medicare Other

## 2022-06-05 ENCOUNTER — Encounter (HOSPITAL_BASED_OUTPATIENT_CLINIC_OR_DEPARTMENT_OTHER): Payer: Self-pay | Admitting: Emergency Medicine

## 2022-06-05 ENCOUNTER — Emergency Department (HOSPITAL_BASED_OUTPATIENT_CLINIC_OR_DEPARTMENT_OTHER)
Admission: EM | Admit: 2022-06-05 | Discharge: 2022-06-05 | Disposition: A | Discharge status: Home / Self Care | Payer: Medicare Other | Attending: Emergency Medicine | Admitting: Emergency Medicine

## 2022-06-05 DIAGNOSIS — Z20822 Contact with and (suspected) exposure to covid-19: Secondary | ICD-10-CM | POA: Diagnosis not present

## 2022-06-05 DIAGNOSIS — R062 Wheezing: Secondary | ICD-10-CM | POA: Insufficient documentation

## 2022-06-05 DIAGNOSIS — I10 Essential (primary) hypertension: Secondary | ICD-10-CM | POA: Insufficient documentation

## 2022-06-05 DIAGNOSIS — J208 Acute bronchitis due to other specified organisms: Secondary | ICD-10-CM

## 2022-06-05 DIAGNOSIS — R0981 Nasal congestion: Secondary | ICD-10-CM | POA: Insufficient documentation

## 2022-06-05 DIAGNOSIS — E119 Type 2 diabetes mellitus without complications: Secondary | ICD-10-CM | POA: Diagnosis not present

## 2022-06-05 DIAGNOSIS — Z7982 Long term (current) use of aspirin: Secondary | ICD-10-CM | POA: Diagnosis not present

## 2022-06-05 DIAGNOSIS — J069 Acute upper respiratory infection, unspecified: Secondary | ICD-10-CM | POA: Diagnosis not present

## 2022-06-05 DIAGNOSIS — Z79899 Other long term (current) drug therapy: Secondary | ICD-10-CM | POA: Diagnosis not present

## 2022-06-05 DIAGNOSIS — J209 Acute bronchitis, unspecified: Secondary | ICD-10-CM | POA: Diagnosis not present

## 2022-06-05 DIAGNOSIS — R059 Cough, unspecified: Secondary | ICD-10-CM | POA: Diagnosis present

## 2022-06-05 LAB — SARS CORONAVIRUS 2 BY RT PCR: SARS Coronavirus 2 by RT PCR: NEGATIVE

## 2022-06-05 LAB — GROUP A STREP BY PCR: Group A Strep by PCR: NOT DETECTED

## 2022-06-05 MED ORDER — BENZONATATE 100 MG PO CAPS: 100.0000 mg | ORAL_CAPSULE | Freq: Three times a day (TID) | ORAL | 0 refills | Status: DC | PRN

## 2022-06-05 NOTE — ED Triage Notes (Signed)
Pt states she has had a cough for the past few days and for the past two mornings when she gets up she has a really sore throat  Denies fever

## 2022-06-05 NOTE — ED Provider Notes (Signed)
[facility] EMERGENCY DEPARTMENT Provider Note   CSN: [id number] Arrival date & time: 06/05/22  6269     History  Chief Complaint  Patient presents with   Cough    Jenny Thompson is a 76 y.o. female.  HPI     76 year old female with a history of diabetes, hypertension, hyperlipidemia, presents with concern for cough congestion and sore throat.  Reports she went to graduation last weekend, and typically wears a mask when she goes out but did not this time.  He was in the [place].  No known sick contacts.  On Tuesday and Wednesday began to have symptoms of the cough.  Reports having some chronic cough at baseline, but this cough is more severe, and is productive of white sputum.  Has associated nasal congestion.  Denies significant sinus pain or ear pain.  Reports she is heard some wheezing, but otherwise has not had any shortness of breath.  Denies any significant fatigue.  Denies nausea, vomiting, diarrhea, fever, or chest pain.  Reports that she wakes up in the morning with a sore throat that improves during the day.  She is supposed to be going to [place] next week to help take care of her sister who has back pain and brother-in-law with Alzheimer's.  No history of smoking, COPD or asthma.  She has not improved with steroids when she had bronchitis in the past and has side effects from prednisone.    Past Medical History:  Diagnosis Date   Diabetes mellitus without complication (HCC)    Hx of degenerative disc disease    Hypercholesteremia    Hypertension      Home Medications Prior to Admission medications   Medication Sig Start Date End Date Taking? Authorizing Provider  benzonatate (TESSALON) 100 MG capsule Take 1 capsule (100 mg total) by mouth 3 (three) times daily as needed for cough. 06/05/22  Yes Alvira Monday, MD  alendronate (FOSAMAX) 70 MG tablet Take 70 mg by mouth once a week. Take with a full glass of water on an empty stomach.    [provider]  aspirin EC 81 MG tablet Take 81 mg by mouth daily.    [provider]  buPROPion (ZYBAN) 150 MG 12 hr tablet Take 150 mg by mouth 2 (two) times daily.    [provider]  calcium-vitamin D 250-100 MG-UNIT tablet Take 1 tablet by mouth 2 (two) times daily.    [provider]  cholecalciferol (VITAMIN D) 1000 units tablet Take 1,000 Units by mouth daily.    [provider]  clidinium-chlordiazePOXIDE (LIBRAX) 5-2.5 MG capsule Take 1 capsule by mouth as needed.    [provider]  hydrochlorothiazide (HYDRODIURIL) 25 MG tablet Take 25 mg by mouth daily.    [provider]  Lido-Capsaicin-Men-Methyl Sal (1ST MEDX-PATCH/ LIDOCAINE) 4-0.374-05-08 % PTCH Apply 1 patch topically daily. 07/02/17   McDonald, Mia A, PA-C  metFORMIN (GLUCOPHAGE) 500 MG tablet Take 500 mg by mouth 2 (two) times daily with a meal.    [provider]  methocarbamol (ROBAXIN) 750 MG tablet Take 750 mg by mouth as needed for muscle spasms.    [provider]  Multiple Vitamin (MULTIVITAMIN) tablet Take 1 tablet by mouth daily.    [provider]  oxyCODONE-acetaminophen (PERCOCET/ROXICET) 5-325 MG tablet Take 1 tablet by mouth every 6 (six) hours as needed for severe pain. 07/02/17   McDonald, Mia A, PA-C  ramipril (ALTACE) 10 MG capsule Take 10 mg by  mouth daily.    [provider]  rosuvastatin (CRESTOR) 20 MG tablet Take 20 mg by mouth daily.    [provider]      Allergies    Prednisone    Review of Systems   Review of Systems  Physical Exam Updated Vital Signs BP (!) 148/70   Pulse 70   Temp 98.6 F (37 C)   Resp 16   Ht 5\' 1"  (1.549 m)   Wt 83.9 kg   SpO2 99%   BMI 34.96 kg/m  Physical Exam Vitals and nursing note reviewed.  Constitutional:      General: She is not in acute distress.    Appearance: She is well-developed. She is not diaphoretic.  HENT:     Head: Normocephalic and atraumatic.      Mouth/Throat:     Pharynx: No oropharyngeal exudate or posterior oropharyngeal erythema.  Eyes:     Conjunctiva/sclera: Conjunctivae normal.  Cardiovascular:     Rate and Rhythm: Normal rate and regular rhythm.     Heart sounds: Normal heart sounds. No murmur heard.    No friction rub. No gallop.  Pulmonary:     Effort: Pulmonary effort is normal. No respiratory distress.     Breath sounds: Wheezing (occasional) present. No rales.  Abdominal:     General: There is no distension.     Palpations: Abdomen is soft.     Tenderness: There is no abdominal tenderness. There is no guarding.  Musculoskeletal:        General: No tenderness.     Cervical back: Normal range of motion.  Skin:    General: Skin is warm and dry.     Findings: No erythema or rash.  Neurological:     Mental Status: She is alert and oriented to person, place, and time.     ED Results / Procedures / Treatments   Labs (all labs ordered are listed, but only abnormal results are displayed) Labs Reviewed  SARS CORONAVIRUS 2 BY RT PCR  GROUP A STREP BY PCR    EKG None  Radiology DG Chest Port 1 View  Result Date: 06/05/2022 CLINICAL DATA:  Coughing and sore throat. EXAM: PORTABLE CHEST 1 VIEW COMPARISON:  Chest CT 09/07/2021. FINDINGS: Heart size and vasculature are normal apart from aortic atherosclerosis. There is a stable mediastinal configuration with mild aortic tortuosity. Marked elevation of the right hemidiaphragm is chronically noted obscuring the lower half of the right chest. The visualized lungs are clear. The sulci are sharp. Slight thoracic S shaped scoliosis. IMPRESSION: No evidence of acute chest disease, but with nonvisualization of the lower right hemithorax due to chronically elevated right hemidiaphragm. Aortic atherosclerosis. Electronically Signed   By: 09/09/2021 M.D.   On: 06/05/2022 06:49    Procedures Procedures    Medications Ordered in ED Medications - No data to display  ED  Course/ Medical Decision Making/ A&P    76 year old female with a history of diabetes, hypertension, hyperlipidemia, presents with concern for cough congestion and sore throat.  Exam and history not consistent with retroperitoneal obstruction, peritonsillar abscess, epiglottitis.  Strep testing is negative.  Chest x-ray was personally evaluated and interpreted by me and shows chronic elevation of the right hemidiaphragm present since at least 2018, without signs of pneumonia, pneumothorax, or pulmonary edema when visualized portion of the lungs.  She has occasional wheezing on exam, without history of known lung disease.  Suspect this is likely secondary to a viral bronchitis, and  also feel that duration of symptoms at this time is most consistent with viral infection.  COVID 19 testing negative.   Discussed recommendation of supportive care for a viral URI and bronchitis.  This includes humidified air, honey, and over-the-counter medications.  Given a prescription for [employer].  At this time given no history of lung disease, and history of diabetes as well as allergy to prednisone, will avoid steroids.  Do not see signs of bacterial infection or indication for antibiotics.  Discussed reasons to return to the ED in detail.          Final Clinical Impression(s) / ED Diagnoses Final diagnoses:  Viral URI with cough  Viral bronchitis    Rx / DC Orders ED Discharge Orders          Ordered    benzonatate (TESSALON) 100 MG capsule  3 times daily PRN        06/05/22 0725              Alvira Monday, MD 06/05/22 0745

## 2022-06-08 ENCOUNTER — Ambulatory Visit (INDEPENDENT_AMBULATORY_CARE_PROVIDER_SITE_OTHER): Payer: Medicare Other | Admitting: Psychiatry

## 2022-06-08 DIAGNOSIS — F4323 Adjustment disorder with mixed anxiety and depressed mood: Secondary | ICD-10-CM

## 2022-06-08 DIAGNOSIS — F341 Dysthymic disorder: Secondary | ICD-10-CM

## 2022-06-08 DIAGNOSIS — Z636 Dependent relative needing care at home: Secondary | ICD-10-CM | POA: Diagnosis not present

## 2022-06-08 NOTE — Progress Notes (Signed)
Psychotherapy Progress Note [facility] Jenny Czar, PhD LP  Patient ID: Jenny Thompson [facility])    MRN: [id number] Therapy format: Individual psychotherapy Date: 06/08/2022      Start: 2:10p     Stop: 3:00p     Time Spent: 50 min Location: In-person   Session narrative (presenting needs, interim history, self-report of stressors and symptoms, applications of prior therapy, status changes, and interventions made in session) Unexpected, first-ever bout of bronchitis recently (strep, COVID neg), picked up at a h.s. graduation she figures.  Great experience at [facility] urgent care on [address].  First ever concerned for breathing ability (pulse ox went to 93, usu 98) and scheduled to fly to [place] this Sunday for a combination of occasions.  Encouraged in self-care working through residual illness.  Continues to worry over Jenny Thompson, especially for her being inconsistent about comprehending her husband's dementia, setting herself up to be taken advantage of by the housekeeper's family she's moved into the place, and continuing to lose mental acuity to her nightly alcohol habit.  She did adopt the B complex recommendation, but no indication she's any clearer.  Meanwhile, Jenny Thompson is supposed to get another nerve block for her back condition but not noticing the conflict with another appointment.  Seems to be confabulating other information.  Support/empathy provided.   Therapeutic modalities: Cognitive Behavioral Therapy, Solution-Oriented/Positive Psychology, and Ego-Supportive  Mental Status/Observations:  Appearance:   Casual     Behavior:  Appropriate  Motor:  Normal  Speech/Language:   Clear and Coherent  Affect:  Appropriate  Mood:  concerned  Thought process:  normal  Thought content:    WNL  Sensory/Perceptual disturbances:    WNL  Orientation:  Fully oriented  Attention:  Good    Concentration:  Good  Memory:  WNL  Insight:    Good  Judgment:   Good  Impulse  Control:  Good   Risk Assessment: Danger to Self: No Self-injurious Behavior: No Danger to Others: No Physical Aggression / Violence: No Duty to Warn: No Access to Firearms a concern: No  Assessment of progress:  progressing  Diagnosis:   ICD-10-CM   1. Adjustment disorder with mixed anxiety and depressed mood  F43.23     2. 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  Can recommend B complex to offset some of the damage, and supportively inquire after her self-care, including counseling. Self-affirm full right to decide which burdens to take on, both while in [place] and on nightly calls While visiting, full authority to enforce privacy  At discretion, may call in local Adult Protective Services at any time if she feels either Jenny Thompson's or Jenny Thompson's welfare are at risk Maintain helpful social support at home, including as desired neighbors, church, others Other recommendations/advice as may be noted above Continue to utilize previously learned skills ad lib Maintain medication as prescribed and work faithfully with relevant prescriber(s) if any changes are desired or seem indicated Call the clinic on-call service, 988/hotline, 911, or present to [facility] or ER if any life-threatening psychiatric crisis Return for time as available. Already scheduled visit in this office 07/12/2022.  Robley Fries, PhD Jenny Czar, PhD LP Clinical Psychologist, [facility] [address] [place], [place] [address] [phone]

## 2022-07-12 ENCOUNTER — Ambulatory Visit (INDEPENDENT_AMBULATORY_CARE_PROVIDER_SITE_OTHER): Payer: Medicare Other | Admitting: Psychiatry

## 2022-07-12 DIAGNOSIS — F4323 Adjustment disorder with mixed anxiety and depressed mood: Secondary | ICD-10-CM | POA: Diagnosis not present

## 2022-07-12 DIAGNOSIS — F341 Dysthymic disorder: Secondary | ICD-10-CM

## 2022-07-12 DIAGNOSIS — Z636 Dependent relative needing care at home: Secondary | ICD-10-CM

## 2022-07-12 NOTE — Progress Notes (Signed)
Psychotherapy Progress Note [facility] Jenny Czar, PhD LP  Patient ID: Jenny Thompson [facility])    MRN: [id number] Therapy format: Individual psychotherapy Date: 07/12/2022      Start: 2:14p     Stop: 3:04p     Time Spent: 50 min Location: In-person   Session narrative (presenting needs, interim history, self-report of stressors and symptoms, applications of prior therapy, status changes, and interventions made in session) Last session was getting over first-ever bronchitis.  That resolved, but then she had kidney stones in [place].  Only symptom blood in urine, no discernible pain.  Continues with well-managed diabetes.  Been losing doctors to retirement and mobility, feels maybe the [facility] system is driving them off as well.  Acknowledged there is always another adjustment to make, but maybe she's getting better at taking on changes.  Affirmed and encouraged.  More concerned still about Jenny Thompson.  Starting to feel more angry as she sees Jenny Thompson manifest more cognitive slippage, e.g., how she framed gift checks for a relative, letting payments slide, and getting herself into this odd and potentially disastrous rent-to-own arrangement with acquaintances on their $700K home, without mortgage or guarantees, and with a contract that actually grants right to still string out payments to [employer] estate in the event they predecease the buyers.  Seems unenforceable, and already talked price down to $700k on a $1.4M property, largely Jenny Thompson bargaining herself down on what seems to only be the impression of having family and company out of the deal as she ages and faces down denial.  Still in habit of 2 hrs every night phone call with Jenny Thompson, which remains draining.  Further encouraged she can call time earlier if she sees fit, reminding Jenny Thompson they'll be back on the next night.  Therapeutic modalities: Cognitive Behavioral Therapy, Solution-Oriented/Positive Psychology, and Ego-Supportive  Mental  Status/Observations:  Appearance:   Casual     Behavior:  Appropriate  Motor:  Normal  Speech/Language:   Clear and Coherent  Affect:  Appropriate  Mood:  concerned  Thought process:  normal  Thought content:    WNL  Sensory/Perceptual disturbances:    WNL  Orientation:  Fully oriented  Attention:  Good    Concentration:  Good  Memory:  WNL  Insight:    Good  Judgment:   Good  Impulse Control:  Good   Risk Assessment: Danger to Self: No Self-injurious Behavior: No Danger to Others: No Physical Aggression / Violence: No Duty to Warn: No Access to Firearms a concern: No  Assessment of progress:  progressing  Diagnosis:   ICD-10-CM   1. Adjustment disorder with mixed anxiety and depressed mood  F43.23     2. 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  Can recommend B complex to offset some of the damage, and supportively inquire after her self-care, including counseling. Self-affirm full right to decide which burdens to take on, both while in [place] and on nightly calls While visiting, full authority to enforce privacy  At discretion, may call in local Adult Protective Services at any time if she feels either Jenny Thompson or Jenny Thompson's welfare are at risk Maintain helpful social support at home, including as desired neighbors, church, others Other recommendations/advice as may be noted above Continue to utilize previously learned skills ad lib Maintain medication as prescribed and work faithfully with relevant prescriber(s)  if any changes are desired or seem indicated Call the clinic on-call service, 988/hotline, 911, or present to [facility] or ER if any life-threatening psychiatric crisis Return in about 1 month (around 08/12/2022). Already scheduled visit in this office 08/10/2022.  Robley Fries, PhD Jenny Czar, PhD LP Clinical Psychologist, [facility] [address], [address] [place], [place] [address] [phone]

## 2022-08-10 ENCOUNTER — Ambulatory Visit (INDEPENDENT_AMBULATORY_CARE_PROVIDER_SITE_OTHER): Payer: Medicare Other | Admitting: Psychiatry

## 2022-08-10 DIAGNOSIS — F341 Dysthymic disorder: Secondary | ICD-10-CM

## 2022-08-10 DIAGNOSIS — Z636 Dependent relative needing care at home: Secondary | ICD-10-CM

## 2022-08-10 DIAGNOSIS — F4323 Adjustment disorder with mixed anxiety and depressed mood: Secondary | ICD-10-CM | POA: Diagnosis not present

## 2022-08-10 NOTE — Progress Notes (Signed)
Psychotherapy Progress Note [facility] Marliss Czar, PhD LP  Patient ID: Jenny Thompson [facility])    MRN: [id number] Therapy format: Individual psychotherapy Date: 08/10/2022      Start: 11:16a     Stop: 12:04p     Time Spent: 48 min Location: In-person   Session narrative (presenting needs, interim history, self-report of stressors and symptoms, applications of prior therapy, status changes, and interventions made in session) Sees [name] getting worse for focus, judgment, and memory, still drinking nightly, still embroiled in a nebulous relationship with her housekeeper/family/home buyers.  Miffed about being told "You'll be mad, but I don't care" and going on to tell of her boarders' little girl knocking to come in and use the bathroom in their house.  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  Did get the chance to convey to [name] that it doesn't matter, ultimately, whether [name] contrariness is personality or dementia, it's still something she'll have to reckon with, for his welfare and her own.  Therapeutic modalities: Cognitive Behavioral Therapy, Solution-Oriented/Positive Psychology, and Ego-Supportive  Mental Status/Observations:  Appearance:   Casual     Behavior:  Appropriate  Motor:  Normal  Speech/Language:   Clear and Coherent  Affect:  Appropriate  Mood:  concerned  Thought process:  normal  Thought content:    WNL  Sensory/Perceptual disturbances:    WNL  Orientation:  Fully oriented  Attention:  Good    Concentration:  Good  Memory:  WNL  Insight:    Good  Judgment:   Good  Impulse Control:  Good   Risk Assessment: Danger to Self: No Self-injurious Behavior: No Danger to Others: No Physical Aggression / Violence: No Duty to  Warn: No Access to Firearms a concern: No  Assessment of progress:  progressing  Diagnosis:   ICD-10-CM   1. Adjustment disorder with mixed anxiety and depressed mood  F43.23     2. 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  Can recommend B complex to offset some of the damage, and supportively inquire after her self-care, including counseling. Best for [name] to assess and prepare for further assistance, either in-home or in an assisted living unit Self-affirm full right to decide which burdens to take on, both while in [place] and on nightly calls While visiting, full authority to enforce privacy  At discretion, may call in local Adult Protective Services at any time if she feels either [name] or [name] welfare are at risk Maintain helpful social support at home, including as desired neighbors, church, others Other recommendations/advice as may be noted above Continue to utilize previously learned skills ad lib Maintain medication as prescribed and work faithfully with relevant prescriber(s) if any changes are desired or seem indicated Call the clinic on-call service, 988/hotline, 911, or present to [facility] or ER if any life-threatening psychiatric crisis Return for session(s) already scheduled. Already scheduled visit in this office 09/14/2022.  Robley Fries, PhD Marliss Czar, PhD LP Clinical Psychologist, [facility] [address] [place], [place] [address] [phone]

## 2022-09-14 ENCOUNTER — Ambulatory Visit (INDEPENDENT_AMBULATORY_CARE_PROVIDER_SITE_OTHER): Payer: Medicare Other | Admitting: Psychiatry

## 2022-09-14 DIAGNOSIS — Z636 Dependent relative needing care at home: Secondary | ICD-10-CM

## 2022-09-14 DIAGNOSIS — F341 Dysthymic disorder: Secondary | ICD-10-CM | POA: Diagnosis not present

## 2022-09-14 DIAGNOSIS — F4323 Adjustment disorder with mixed anxiety and depressed mood: Secondary | ICD-10-CM

## 2022-09-14 NOTE — Progress Notes (Signed)
Psychotherapy Progress Note [facility] Luan Moore, PhD LP  Patient ID: Jenny Thompson [facility])    MRN: [id number] Therapy format: Individual psychotherapy Date: 09/14/2022      Start: 11:31p     Stop: 12:18p     Time Spent: 47 min Location: In-person   Session narrative (presenting needs, interim history, self-report of stressors and symptoms, applications of prior therapy, status changes, and interventions made in session) Some more difficulty sleeping lately, largely attributed to worry over [name] messy situation.  Knows the contract is up end of this week, with no resolution in sight for the people to purchase the property, [name] has dragged her heels, and [name] is getting more reactive to strangers on the property.  Tried to tell her to put away his guns after he told Cassandria Santee he's tired of people coming on the property, he has guns and knows how to use them, but she was adamant [name] would never harm anyone.  [name] messed up her tax return, too, uncharacteristically, may not be able to trust her attention and STM, though she still insists on trying to be helpful.  [name] gets routinely repetitive about people she's lost touch with and grievances against them for effectively abandoning her.    Support/empathy provided for plethora of worries she really can't guarantee doing anything about.  Re. the stress of long, nightly phone calls, recommend asking [name] to agree that if they talk at any length about problems -- [name] behavior, [name]'s family taking advantage, people falling away that she thought would stay -- that they will either keep it shorter or direct the conversation to an action step.    Therapeutic modalities: Cognitive Behavioral Therapy, Solution-Oriented/Positive Psychology, and Ego-Supportive  Mental Status/Observations:  Appearance:   Casual     Behavior:  Appropriate  Motor:  Normal  Speech/Language:   Clear and Coherent  Affect:  Appropriate  Mood:  sad, worried   Thought process:  normal  Thought content:    WNL  Sensory/Perceptual disturbances:    WNL  Orientation:  Fully oriented  Attention:  Good    Concentration:  Good  Memory:  WNL  Insight:    Good  Judgment:   Good  Impulse Control:  Good   Risk Assessment: Danger to Self: No Self-injurious Behavior: No Danger to Others: No Physical Aggression / Violence: No Duty to Warn: No Access to Firearms a concern: No  Assessment of progress:  stabilized  Diagnosis:   ICD-10-CM   1. Adjustment disorder with mixed anxiety and depressed mood  F43.23     2. 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  Can recommend B complex to offset some of the damage, and supportively inquire after her self-care, including counseling. Best for [name] to assess and prepare for further assistance, either in-home or in an assisted living unit Self-affirm full right to decide which burdens to take on herself, both while in [place] and on nightly calls OK to limit number, duration, and direction of phone calls While visiting, full authority to enforce privacy At discretion, may call in local Adult Protective Services at any time if she feels either [name] or [name] welfare are at risk Possible friendly offering to [employer] therapist, but preferably work openly with, an through [employer] health care recommendations, especially as re diabetes  care Maintain helpful social support at home, including as desired neighbors, church, others Al-Anon option Other recommendations/advice as may be noted above Continue to utilize previously learned skills ad lib Maintain medication as prescribed and work faithfully with relevant prescriber(s) if any changes are desired or seem indicated Call the clinic on-call service, 988/hotline, 911, or present to [facility] or ER if any life-threatening psychiatric  crisis Return for time as already scheduled. Already scheduled visit in this office 10/07/2022.  [name], PhD Luan Moore, PhD LP Clinical Psychologist, [facility] [address] [place], [place] [address] [phone]

## 2022-10-07 ENCOUNTER — Ambulatory Visit: Payer: Federal, State, Local not specified - PPO | Admitting: Psychiatry

## 2022-11-08 ENCOUNTER — Ambulatory Visit (INDEPENDENT_AMBULATORY_CARE_PROVIDER_SITE_OTHER): Payer: Medicare Other | Admitting: Psychiatry

## 2022-11-08 DIAGNOSIS — F4323 Adjustment disorder with mixed anxiety and depressed mood: Secondary | ICD-10-CM | POA: Diagnosis not present

## 2022-11-08 DIAGNOSIS — Z636 Dependent relative needing care at home: Secondary | ICD-10-CM

## 2022-11-08 DIAGNOSIS — F341 Dysthymic disorder: Secondary | ICD-10-CM | POA: Diagnosis not present

## 2022-11-08 NOTE — Progress Notes (Signed)
Psychotherapy Progress Note [facility] Jenny Moore, PhD LP  Patient ID: Jenny Thompson [facility])    MRN: [id number] Therapy format: Individual psychotherapy Date: 11/08/2022      Start: 3:23p     Stop: 4:10p     Time Spent: 47 min Location: In-person   Session narrative (presenting needs, interim history, self-report of stressors and symptoms, applications of prior therapy, status changes, and interventions made in session) Been through kidney stones, lithotripsy, and possible COVID since last seen.  Deferring vaccine to January on med advice.   Visited Jenny Thompson and Jenny Thompson, had a basically good time in [place] at an AirBnB.  Massage therapist actually asked Jenny Thompson why she can't say no to Jenny Thompson, but says she's not sure she means to, suggesting it's a version of loyalty to her sister, who is in some sense sick.  She does see Jenny Thompson being gullible and practicing denial/repression, with ongoing reluctance to ask questions of her health care or to fold Jenny Thompson into her own medical visits, including an increasingly bad back.  Up in the air whether she will have 1 or 2 surgeries, each with 6-week recoveries and the implied demand for Jenny Thompson to drop everything and be her home care.    Discussed values, wishes, and responses to situations with sister and family.  Encouraged further and reshaping conversations, practicing boundaries, and begging consideration of reasonable conditions and assertiveness with husband and squatting friends.  Therapeutic modalities: Cognitive Behavioral Therapy, Solution-Oriented/Positive Psychology, and Ego-Supportive  Mental Status/Observations:  Appearance:   Casual     Behavior:  Appropriate  Motor:  Normal  Speech/Language:   Clear and Coherent  Affect:  Appropriate  Mood:  dysthymic  Thought process:  normal  Thought content:    WNL  Sensory/Perceptual disturbances:    WNL  Orientation:  Fully oriented  Attention:  Good    Concentration:   Good  Memory:  WNL  Insight:    Good  Judgment:   Good  Impulse Control:  Good   Risk Assessment: Danger to Self: No Self-injurious Behavior: No Danger to Others: No Physical Aggression / Violence: No Duty to Warn: No Access to Firearms a concern: No  Assessment of progress:  stabilized  Diagnosis:   ICD-10-CM   1. Adjustment disorder with mixed anxiety and depressed mood  F43.23     2. 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  Can recommend B complex to offset some of the damage, and supportively inquire after her self-care, including counseling. Best for Jenny Thompson to assess and prepare for further assistance, either in-home or in an assisted living unit Self-affirm full right to decide which burdens to take on herself, both while in [place] and on nightly calls OK to limit number, duration, and direction of phone calls While visiting, full authority to enforce privacy At discretion, may call in local Adult Protective Services at any time if she feels either Jenny Thompson's or Jenny Thompson's welfare are at risk Possible friendly offering to [employer] therapist, but preferably work openly with, an through [employer] health care recommendations, especially as re diabetes care Maintain helpful social support at home, including as desired neighbors, church, others Al-Anon option Other recommendations/advice as may be noted above Continue to utilize previously learned skills ad lib Maintain medication as prescribed and work faithfully with relevant  prescriber(s) if any changes are desired or seem indicated Call the clinic on-call service, 988/hotline, 911, or present to [facility] or ER if any life-threatening psychiatric crisis Return for time as already scheduled. Already scheduled visit in this office 11/24/2022.  Jenny Thompson Serve, PhD Jenny Moore, PhD LP Clinical Psychologist,  [facility] [address], [place], [place] [address] [phone]

## 2022-11-16 IMAGING — DX DG CHEST 1V PORT
1 series · 1 of 1 positions shown · non-contrast
Comparison: Chest CT 09/07/2021.

CLINICAL DATA: Coughing and sore throat.

EXAM:
PORTABLE CHEST 1 VIEW

[chest ap]
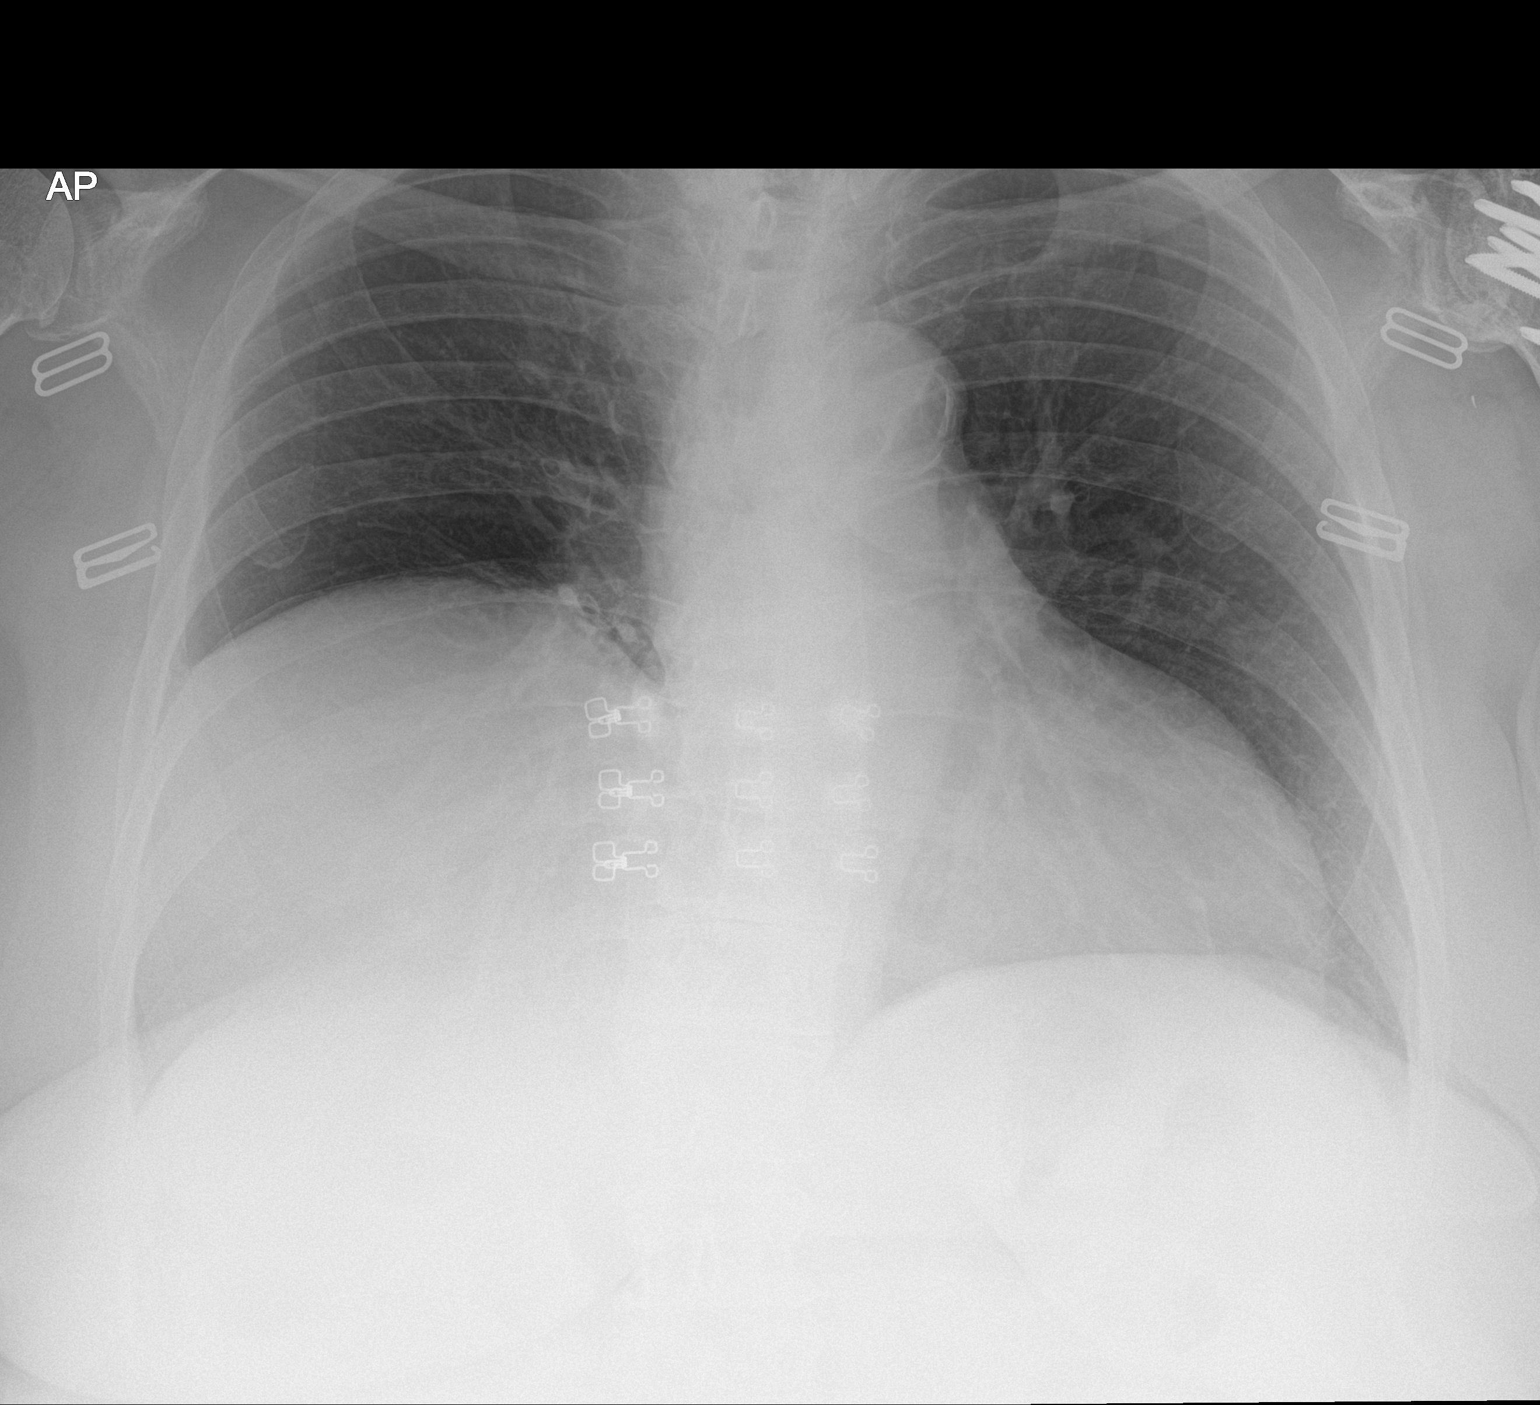

[1 of 1 positions shown; findings below may reference images not displayed]

FINDINGS: Heart size and vasculature are normal apart from aortic
atherosclerosis. There is a stable mediastinal configuration with
mild aortic tortuosity.

Marked elevation of the right hemidiaphragm is chronically noted
obscuring the lower half of the right chest. The visualized lungs
are clear. The sulci are sharp. Slight thoracic S shaped scoliosis.
IMPRESSION: No evidence of acute chest disease, but with nonvisualization of the
lower right hemithorax due to chronically elevated right
hemidiaphragm. Aortic atherosclerosis.

## 2022-11-24 ENCOUNTER — Ambulatory Visit (INDEPENDENT_AMBULATORY_CARE_PROVIDER_SITE_OTHER): Payer: Medicare Other | Admitting: Psychiatry

## 2022-11-24 DIAGNOSIS — F341 Dysthymic disorder: Secondary | ICD-10-CM

## 2022-11-24 DIAGNOSIS — Z636 Dependent relative needing care at home: Secondary | ICD-10-CM

## 2022-11-24 DIAGNOSIS — F4323 Adjustment disorder with mixed anxiety and depressed mood: Secondary | ICD-10-CM | POA: Diagnosis not present

## 2022-11-24 NOTE — Progress Notes (Signed)
Psychotherapy Progress Note [facility] Luan Moore, PhD LP  Patient ID: Jenny Thompson [facility])    MRN: [id number] Therapy format: Individual psychotherapy Date: 11/24/2022      Start: 3:01p     Stop: 3:48p     Time Spent: 47 min Location: In-person   Session narrative (presenting needs, interim history, self-report of stressors and symptoms, applications of prior therapy, status changes, and interventions made in session) Jenny Thompson has scheduled her back surgery now, 12/14.  Jasiah will be traveling tomorrow, but irritated that Jenny Thompson doesn't think these things through, accommodations will be cramped, and Jenny Thompson keeps getting herself stuck indulging the purported rent/buyers squatting on her property.  Still c/o Jenny Thompson interrupting their own phone calls to indulge her people.  Per prior advice, has established a 9pm cutoff for calls, which helps.    Suggestion to shift from advocating actions with Jenny Thompson (that she'll turn around and naysay) to observing how they have the same conversation repeatedly, and it sounds like Jenny Thompson is just that conflicted about taking action and asserting herself, just that compulsive about not upsetting anyone, much like their mother used to be.    Worries about Jenny Thompson getting skinny and malnourished, but she is on a nutritional drink now and relatively stable, although she does continue to drink substantially and regularly, and undoubtedly is courting vitamin deficiencies.  At least 6 potent drinks per evening, plus the Xanax she is prescribed.  Discussed the likely issue she will have with surgery and recovery, and the importance of coming clean with her surgeon, in her own best interest.  Therapeutic modalities: Cognitive Behavioral Therapy, Solution-Oriented/Positive Psychology, and Ego-Supportive  Mental Status/Observations:  Appearance:   Casual     Behavior:  Appropriate  Motor:  Normal  Speech/Language:   Clear and Coherent  Affect:  Appropriate  Mood:   tense  Thought process:  normal  Thought content:    WNL  Sensory/Perceptual disturbances:    WNL  Orientation:  Fully oriented  Attention:  Good    Concentration:  Good  Memory:  WNL  Insight:    Good  Judgment:   Good  Impulse Control:  Good   Risk Assessment: Danger to Self: No Self-injurious Behavior: No Danger to Others: No Physical Aggression / Violence: No Duty to Warn: No Access to Firearms a concern: No  Assessment of progress:  stabilized  Diagnosis:   ICD-10-CM   1. Adjustment disorder with mixed anxiety and depressed mood  F43.23     2. 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  Acknowledged Jenny Thompson will still most likely resist, but it established his best thoughts of being heard, and possibly acted on.  Other tactics for defeating manipulation and martyring, should Jenny Thompson indulge those. Best for Jenny Thompson to stop alcohol, but clear she won't.  Can recommend B complex to offset some of the damage, and supportively inquire after her self-care, including counseling. Best for [name] to assess and prepare for further assistance, either in-home or in an assisted living unit Self-affirm full right to decide which burdens to take on herself, both while in [place] and on nightly calls OK to limit number, duration, and direction of phone calls  While visiting, full authority to enforce privacy At discretion, may call in local Adult Protective Services at any time if she feels either [name] or [name] welfare are at risk Possible friendly offering to [employer] therapist, but preferably work openly with, an through [employer] health care recommendations, especially as re diabetes care Maintain helpful  social support at home, including as desired neighbors, church, others Al-Anon option Other recommendations/advice as may be noted above Continue to utilize previously learned skills ad lib Maintain medication as prescribed and work faithfully with relevant prescriber(s) if any changes are desired or seem indicated Call the clinic on-call service, 988/hotline, 911, or present to [facility] or ER if any life-threatening psychiatric crisis Return for time as available, time as already scheduled. Already scheduled visit in this office 01/04/2023.  [name], PhD Luan Moore, PhD LP Clinical Psychologist, [facility] [address], [place], [place] [address] [phone]

## 2023-01-04 ENCOUNTER — Ambulatory Visit (INDEPENDENT_AMBULATORY_CARE_PROVIDER_SITE_OTHER): Payer: Medicare Other | Admitting: Psychiatry

## 2023-01-04 DIAGNOSIS — F341 Dysthymic disorder: Secondary | ICD-10-CM | POA: Diagnosis not present

## 2023-01-04 DIAGNOSIS — F4323 Adjustment disorder with mixed anxiety and depressed mood: Secondary | ICD-10-CM

## 2023-01-04 DIAGNOSIS — Z636 Dependent relative needing care at home: Secondary | ICD-10-CM

## 2023-01-04 NOTE — Progress Notes (Signed)
Psychotherapy Progress Note [facility] Luan Moore, PhD LP  Patient ID: Jenny Thompson [facility])    MRN: [id number] in  Therapy format: Individual psychotherapy Date: 01/04/2023      Start: 10:09a     Stop: 10:58a     Time Spent: 49 min Location: In-person   Session narrative (presenting needs, interim history, self-report of stressors and symptoms, applications of prior therapy, status changes, and interventions made in session) Jenny Thompson had surgery mid December.  Went down to [place] to help her, had to watch her be restless, still smoking, and risking reinjury by reflexively bending over to pick things up.  Was nervy, intimidating, often thankless, and seemingly was near burning out watching the dysfunctions down there.  Jenny Thompson further irritated by [employer], throwing off on her as bossy, when she was making a sacrifice to help.  Irritated another way by his PT histrionically making small talk and telling her she should move down and get her a beau.  Also by Jenny Thompson's cleaning lady/boarder echoing the "bossy" commentary, enough to want to just pack up quickly and come home early.  Did not, keeping to her pledge to help.  Irritated further after arriving home, as Jenny Thompson seemed to imply the expectation she be happy for Jenny Thompson over things like the boarders "helping" with a minor property issue, or her getting Jenny Thompson to do a little something for himself, none of which makes up for the offensive parts, and none of which offsets genuine worries about Jenny Thompson falling, which she has.  Support/empathy provided, reaffirmed principles of deciding whether to confront her and asking for listening if she wants to recruit change.  Further hardships dealing with record-keeping errors at her PCP office [facility]) and her [facility] provider threatening to cancel her policy for inadequate roofing and "debris in the yard", based on a misunderstanding what one dilapidated structure is.  Discussed HOI reasoning and galvanized to make  reasonable clean ups alongside reasonable arguments about what the property is.  Therapeutic modalities: Cognitive Behavioral Therapy, Solution-Oriented/Positive Psychology, and Ego-Supportive  Mental Status/Observations:  Appearance:   Casual     Behavior:  Appropriate  Motor:  Normal  Speech/Language:   Clear and Coherent  Affect:  Appropriate  Mood:  frustrated  Thought process:  normal  Thought content:    WNL  Sensory/Perceptual disturbances:    WNL  Orientation:  Fully oriented  Attention:  Good    Concentration:  Good  Memory:  WNL  Insight:    Good  Judgment:   Good  Impulse Control:  Good   Risk Assessment: Danger to Self: No Self-injurious Behavior: No Danger to Others: No Physical Aggression / Violence: No Duty to Warn: No Access to Firearms a concern: No  Assessment of progress:  progressing  Diagnosis:   ICD-10-CM   1. Adjustment disorder with mixed anxiety and depressed mood  F43.23     2. 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  Acknowledged Jenny Thompson will still most likely resist, but it established his best thoughts of being heard, and possibly acted on.  Other tactics for defeating manipulation and martyring, should Jenny Thompson indulge those. Best for Jenny Thompson to stop alcohol, but clear  she won't.  Can recommend B complex to offset some of the damage, and supportively inquire after her self-care, including counseling. Best for Jenny Thompson to assess and prepare for further assistance, either in-home or in an assisted living unit Self-affirm full right to decide which burdens to take on herself, both while in [place] and on nightly calls OK to limit number, duration, and direction of phone  calls While visiting, full authority to enforce privacy At discretion, may call in local Adult Protective Services at any time if she feels either Jenny Thompson's or Jenny Thompson's welfare are at risk Possible friendly offering to [employer] therapist, but preferably work openly with, an through [employer] health care recommendations, especially as re diabetes care Maintain helpful social support at home, including as desired neighbors, church, others Al-Anon option Other recommendations/advice as may be noted above Continue to utilize previously learned skills ad lib Maintain medication as prescribed and work faithfully with relevant prescriber(s) if any changes are desired or seem indicated Call the clinic on-call service, 988/hotline, 911, or present to [facility] or ER if any life-threatening psychiatric crisis Return for time as already scheduled. Already scheduled visit in this office 02/02/2023.  [name], PhD Luan Moore, PhD LP Clinical Psychologist, [facility] [address], [place], [place] [address] [phone]

## 2023-02-02 ENCOUNTER — Ambulatory Visit (INDEPENDENT_AMBULATORY_CARE_PROVIDER_SITE_OTHER): Payer: Medicare Other | Admitting: Psychiatry

## 2023-02-02 DIAGNOSIS — F4323 Adjustment disorder with mixed anxiety and depressed mood: Secondary | ICD-10-CM

## 2023-02-02 DIAGNOSIS — Z636 Dependent relative needing care at home: Secondary | ICD-10-CM | POA: Diagnosis not present

## 2023-02-02 DIAGNOSIS — F341 Dysthymic disorder: Secondary | ICD-10-CM | POA: Diagnosis not present

## 2023-02-02 NOTE — Progress Notes (Signed)
Psychotherapy Progress Note [facility] Luan Moore, PhD LP  Patient ID: Jenny Thompson [facility])    MRN: [id number] Therapy format: Individual psychotherapy Date: 02/02/2023      Start: 11:15a     Stop: 12:02p     Time Spent: 47 min Location: In-person   Session narrative (presenting needs, interim history, self-report of stressors and symptoms, applications of prior therapy, status changes, and interventions made in session) Continues nightly phone calls with Jenny Thompson, largely practicing silence in the face of repetitive complaints and self-contradictions, but sometimes feels a need to get her to see reason about her self-defeating habits with Jenny Thompson and his dementia and with the pseudo-friends/pseudo-renters at her property.  Most nights, Jenny Thompson goes through the same litany of complaints -- grocery being out of things, trying to work out the renters, Jenny Thompson's needs, nobody calling her -- and every evening she is intoxicated.  Jenny Thompson is still reliably cutting off at 9pm.  Still worry for Jenny Thompson losing her faculties, as her memory keeps eroding and no end in sight for her drinking habit, rationalizing Jenny Thompson's decline, rationalizing the squatter situation.  Reaffirmed that Jenny Thompson has the right to set limits on pointless, frustrating interaction and the right to continue if it honestly serves her values.  Reaffirmed that the situation will get worse on its own and require some change of heart or intervention and what Jenny Thompson options are.  Has lost 6 friends at Thompson this year.  Latest yesterday, in her 62s.  Condolences offered.  Therapeutic modalities: Cognitive Behavioral Therapy, Solution-Oriented/Positive Psychology, and Ego-Supportive  Mental Status/Observations:  Appearance:   Casual     Behavior:  Appropriate  Motor:  Normal  Speech/Language:   Clear and Coherent  Affect:  Appropriate  Mood:  sad  Thought process:  normal  Thought content:    WNL  Sensory/Perceptual disturbances:     WNL  Orientation:  Fully oriented  Attention:  Good    Concentration:  Good  Memory:  WNL  Insight:    Good  Judgment:   Good  Impulse Control:  Good   Risk Assessment: Danger to Self: No Self-injurious Behavior: No Danger to Others: No Physical Aggression / Violence: No Duty to Warn: No Access to Firearms a concern: No  Assessment of progress:  stabilized  Diagnosis:   ICD-10-CM   1. Adjustment disorder with mixed anxiety and depressed mood  F43.23     2. 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  Acknowledged Jenny Thompson will still most likely resist, but it established his best thoughts of being heard, and possibly acted on.  Other tactics for defeating manipulation and martyring, should Jenny Thompson indulge those. Best for Jenny Thompson to stop alcohol, but clear she won't.  Can recommend B complex to offset some of the damage, and supportively inquire after her self-care, including counseling. Best for Jenny Thompson to assess and prepare for further assistance, either in-home or in an assisted living unit Self-affirm full right to decide which burdens to take on herself, both while in [place] and on nightly calls OK to limit number, duration, and direction of phone calls While visiting, full authority to enforce privacy At discretion, may call in local Adult  Protective Services at any time if she feels either Jenny Thompson or Jenny Thompson's welfare are at risk Possible friendly offering to [employer] therapist, but preferably work openly with, an through [employer] health care recommendations, especially as re diabetes care Maintain helpful social support at home, including as desired neighbors, Thompson, others Al-Anon option Other  recommendations/advice as may be noted above Continue to utilize previously learned skills ad lib Maintain medication as prescribed and work faithfully with relevant prescriber(s) if any changes are desired or seem indicated Call the clinic on-call service, 988/hotline, 911, or present to [facility] or ER if any life-threatening psychiatric crisis Return for time as already scheduled. Already scheduled visit in this office 03/04/2023.  [name], PhD Luan Moore, PhD LP Clinical Psychologist, [facility] [address] [place], [place] [address] [phone]

## 2023-03-04 ENCOUNTER — Ambulatory Visit (INDEPENDENT_AMBULATORY_CARE_PROVIDER_SITE_OTHER): Payer: Medicare Other | Admitting: Psychiatry

## 2023-03-04 DIAGNOSIS — F4323 Adjustment disorder with mixed anxiety and depressed mood: Secondary | ICD-10-CM | POA: Diagnosis not present

## 2023-03-04 DIAGNOSIS — F341 Dysthymic disorder: Secondary | ICD-10-CM | POA: Diagnosis not present

## 2023-03-04 DIAGNOSIS — Z636 Dependent relative needing care at home: Secondary | ICD-10-CM

## 2023-03-04 NOTE — Progress Notes (Signed)
Psychotherapy Progress Note [facility] Marliss Czar, PhD LP  Patient ID: Jenny [facility])    MRN: [id number] Therapy format: Individual psychotherapy Date: 03/04/2023      Start: 2:11p     Stop: 2:58p     Time Spent: 47 min Location: In-person   Session narrative (presenting needs, interim history, self-report of stressors and symptoms, applications of prior therapy, status changes, and interventions made in session) Just traveled for Jenny Thompson's birthday and beach trip.  Feels she did better about being frustrated without losing it.  One pointed episode where Jenny Thompson discovered Jenny Thompson had messed the bed, couldn't get him to shower, told Jenny Thompson about it, but Jenny Thompson didn't take it seriously until pressed repeatedly, saying she "didn't think it was that serious".  (!)  Continuing observations of Jenny Thompson overworking herself, catering unrealistically to [employer] in other ways, drinking amply on a daily basis, and apparently dulling in her own response to needs.  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  For her part, Jenny Thompson decidedly doing better about not "carrying" the issues.  Discussed further how she'd like to respond to her concerns, still not up to forcing the issue of Jenny Thompson drinking, but perhaps closer to letting her know she's suffering ill effects.  Therapeutic modalities: Cognitive Behavioral Therapy, Solution-Oriented/Positive Psychology, and Ego-Supportive  Mental Status/Observations:  Appearance:   Casual     Behavior:  Appropriate  Motor:  Normal  Speech/Language:   Clear and Coherent  Affect:  Appropriate  Mood:  dysthymic and concerned  Thought process:  normal  Thought content:    WNL  Sensory/Perceptual disturbances:    WNL  Orientation:  Fully oriented  Attention:  Good    Concentration:  Good  Memory:  WNL  Insight:    Good  Judgment:   Good  Impulse Control:  Good   Risk  Assessment: Danger to Self: No Self-injurious Behavior: No Danger to Others: No Physical Aggression / Violence: No Duty to Warn: No Access to Firearms a concern: No  Assessment of progress:  progressing  Diagnosis:   ICD-10-CM   1. Adjustment disorder with mixed anxiety and depressed mood  F43.23     2. 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  Acknowledged Jenny Thompson will still most likely resist, but it established his best thoughts of being heard, and possibly acted on.  Other tactics for defeating manipulation and martyring, should Jenny Thompson indulge those. Best for Jenny Thompson to stop alcohol, but clear she won't.  Can recommend B complex to offset some of the damage, and supportively inquire after her self-care, including counseling. Best for Jenny Thompson to assess and prepare for further assistance, either in-home or in an assisted living unit Self-affirm full right to decide which burdens to take on herself, both while in [place] and on nightly calls OK to limit number, duration, and direction of phone calls While visiting, full authority to enforce privacy At discretion, may call in local Adult Protective Services at any time if she feels either Jenny Thompson's or Jenny Thompson's welfare are at risk Possible friendly offering to [name] therapist, but preferably work openly with, an through [employer]  Self-care Maintain health care recommendations, especially as re diabetes care Maintain helpful social support at home, including as desired neighbors, church, others Al-Anon option Other recommendations/advice as may be noted above Continue to utilize previously learned skills ad lib Maintain medication as prescribed and work faithfully with relevant  prescriber(s) if any changes are desired or seem indicated Call the clinic on-call service, 988/hotline, 911, or present to [facility] or ER if any life-threatening psychiatric crisis Return for time as already scheduled. Already scheduled visit in this office 04/05/2023.  Robley Fries, PhD Marliss Czar, PhD LP Clinical Psychologist, [facility] [address], [address] [place], [place] [address] [phone]

## 2023-04-05 ENCOUNTER — Ambulatory Visit: Payer: Medicare Other | Admitting: Psychiatry

## 2023-04-05 DIAGNOSIS — Z636 Dependent relative needing care at home: Secondary | ICD-10-CM | POA: Diagnosis not present

## 2023-04-05 DIAGNOSIS — F4323 Adjustment disorder with mixed anxiety and depressed mood: Secondary | ICD-10-CM

## 2023-04-05 DIAGNOSIS — F341 Dysthymic disorder: Secondary | ICD-10-CM

## 2023-04-05 NOTE — Progress Notes (Signed)
Psychotherapy Progress Note [facility] Jenny Czar, PhD LP  Patient ID: Jenny Thompson [facility])    MRN: [id number] Therapy format: Individual psychotherapy Date: 04/05/2023      Start: 2:04p     Stop: 2:53p     Time Spent: 49 min Location: In-person   Session narrative (presenting needs, interim history, self-report of stressors and symptoms, applications of prior therapy, status changes, and interventions made in session) Some concern for neighbors possibly neglecting a dog.  Getting the decrepit buildings in her backyard torn down per homeowners insurance orders.  Close neighbor Jenny Thompson is recovering from surgery, and Jenny Thompson remains available to pick up the kids from school on an episodic basis.    Main concern of Jenny Thompson and Jenny Thompson -- no developments, but Jenny Thompson keeps saying she's going to have to "make a decision" about her living situation, housing, and care of Jenny Thompson.  Still seems locked into trying to take care of Jenny Thompson herself, but he has started refusing more assertively to bathe, and Jenny Thompson is now fearing (more or less irrationally) that the CNA will report him.  Jenny Thompson plans on going down again this summer, but more tentative about it since the latest hardship.  Discussed and validated grief seeing Jenny Thompson decline with stress and drinking, stay put with ignorance, and otherwise "defect" from the "tribe" of people Jenny Thompson counts on who have good sense and are genuinely interested and motivated to do more sensible things.  Massage therapist accuses her, perhaps prematurely, of "enabling" Jenny Thompson by buying her cat food and incontinent supplies.  Discussed the feedback, agreed it is a bit of a snap judgment from someone who cares about her, and truly "enabling" means sparing someone the natural consequences of their own addiction, not giving them a helping hand.  That said, Jenny Thompson in all likelihood does need some more stimulus to settle her untenable living situation and dubious home sale.  Discussed  approach with her, including the difficulty of finding a sober moment.  Therapeutic modalities: Cognitive Behavioral Therapy, Solution-Oriented/Positive Psychology, Ego-Supportive, and Assertiveness/Communication  Mental Status/Observations:  Appearance:   Casual     Behavior:  Appropriate  Motor:  Normal  Speech/Language:   Clear and Coherent  Affect:  Appropriate  Mood:  Approp to topic  Thought process:  normal  Thought content:    WNL  Sensory/Perceptual disturbances:    WNL  Orientation:  Fully oriented  Attention:  Good    Concentration:  Good  Memory:  WNL  Insight:    Good  Judgment:   Good  Impulse Control:  Good   Risk Assessment: Danger to Self: No Self-injurious Behavior: No Danger to Others: No Physical Aggression / Violence: No Duty to Warn: No Access to Firearms a concern: No  Assessment of progress:  progressing  Diagnosis:   ICD-10-CM   1. Adjustment disorder with mixed anxiety and depressed mood  F43.23     2. 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  Acknowledged Jenny Thompson will still most likely resist, but it established his best thoughts of being heard, and possibly acted on.  Other tactics for defeating manipulation and martyring, should Jenny Thompson indulge those. Best for Jenny Thompson to stop alcohol,  but clear she won't.  Can recommend B complex to offset some of the damage, and supportively inquire after her self-care, including counseling. Best for Jenny Thompson to assess and prepare for further assistance, either in-home or in an assisted living unit Self-affirm full right to decide which burdens to take on herself, both while in [place] and on nightly calls OK to limit number, duration, and  direction of phone calls While visiting, full authority to enforce privacy At discretion, may call in local Adult Protective Services at any time if she feels either Jenny Thompson's or Jenny Thompson's welfare are at risk Possible friendly offering to [name] therapist, but preferably work openly with, and through, [employer] health care recommendations, especially as re diabetes care Maintain helpful social support at home, including as desired neighbors, church, others Al-Anon option Other recommendations/advice as may be noted above Continue to utilize previously learned skills ad lib Maintain medication as prescribed and work faithfully with relevant prescriber(s) if any changes are desired or seem indicated Call the clinic on-call service, 988/hotline, 911, or present to [facility] or ER if any life-threatening psychiatric crisis Return for time as already scheduled. Already scheduled visit in this office 05/05/2023.  Robley Fries, PhD Jenny Czar, PhD LP Clinical Psychologist, [facility] [address], [address] [place], [place] [address] [phone]

## 2023-05-05 ENCOUNTER — Ambulatory Visit: Payer: Medicare Other | Admitting: Psychiatry

## 2023-06-03 ENCOUNTER — Ambulatory Visit (INDEPENDENT_AMBULATORY_CARE_PROVIDER_SITE_OTHER): Payer: Medicare Other | Admitting: Psychiatry

## 2023-06-03 DIAGNOSIS — F4323 Adjustment disorder with mixed anxiety and depressed mood: Secondary | ICD-10-CM | POA: Diagnosis not present

## 2023-06-03 DIAGNOSIS — F341 Dysthymic disorder: Secondary | ICD-10-CM

## 2023-06-03 DIAGNOSIS — Z636 Dependent relative needing care at home: Secondary | ICD-10-CM

## 2023-06-03 NOTE — Progress Notes (Signed)
Psychotherapy Progress Note [facility] Jenny Czar, PhD LP  Patient ID: Jenny Thompson [facility])    MRN: [id number] Therapy format: Individual psychotherapy Date: 06/03/2023      Start: 11:14a     Stop: 12:02p     Time Spent: 48 min Location: In-person   Session narrative (presenting needs, interim history, self-report of stressors and symptoms, applications of prior therapy, status changes, and interventions made in session) Finally got set to see niece in [place], in May, when got a call about Jenny Thompson, in [facility], being hospitalized after taking a fall, found unconscious, had a seizure.  Jenny Thompson had to make a same-day flight down to support.  Murky word on assessment, but sounds like they found lesions that preceded her fall.  Had to be highly frustrated by Jenny Thompson and Jenny Thompson's irrational responses to care, like refusing MRI, deciding steroids would spike her BP, insisting on not hearing the "s" word (stroke), and seeking a neurologist several hours from home.  Background, too, that Jenny Thompson has been deeply embroiled in alternative medicine, conspiracy theories, and prone to doubt health care and order people around.  Hardest 12 days in a long while, including times noted visiting [place].  And caught COVID on the way back.  Re. Jenny Thompson's situation, a saving grace -- Jenny Thompson and Jenny Thompson have felt the same concerns and recently revealed they actually contracted to buy Jenny Thompson's property in [place] as an investment and a shield against her being taken advantage of.  Confirming Jenny Thompson' suspicions, Jenny Thompson's "friends" moved out of the guest house almost immediately, manipulatively blaming Jenny Thompson for making the children "homeless".  Jenny Thompson was stunned to see her friends (squatters) moved out just on learning of it, and getting blamed, but apparently she'll still have her housekeeper (their Science writer) and now have safety from getting used to high 6 figures.    Jenny Thompson about making her summer trip to [place] to see Jenny Thompson and Jenny Thompson.   Jenny Thompson has suggested maybe she and Jenny Thompson could travel, but she has mystery persistent diarrhea, and his entitled behavior and dementia and incontinence issues make it very unrealistic, not to mention too offputting to sign onto visiting them.  Discussed communication strategy  Therapeutic modalities: Cognitive Behavioral Therapy, Solution-Oriented/Positive Psychology, and Ego-Supportive  Mental Status/Observations:  Appearance:   Casual     Behavior:  Appropriate  Motor:  Normal  Speech/Language:   Clear and Coherent  Affect:  Appropriate  Mood:  concerned  Thought process:  normal  Thought content:    WNL  Sensory/Perceptual disturbances:    WNL  Orientation:  Fully oriented  Attention:  Good    Concentration:  Good  Memory:  WNL  Insight:    Good  Judgment:   Good  Impulse Control:  Good   Risk Assessment: Danger to Self: No Self-injurious Behavior: No Danger to Others: No Physical Aggression / Violence: No Duty to Warn: No Access to Firearms a concern: No  Assessment of progress:  progressing  Diagnosis:   ICD-10-CM   1. Adjustment disorder with mixed anxiety and depressed mood  F43.23     2. 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  Acknowledged Jenny Thompson will still most likely resist,  but it established his best thoughts of being heard, and possibly acted on.  Other tactics for defeating manipulation and martyring, should Jenny Thompson indulge those. Best for Jenny Thompson to stop alcohol, but clear she won't.  Can recommend B complex to offset some of the damage, and supportively inquire after her self-care, including counseling. Best for Jenny Thompson to assess and prepare for further assistance, either in-home or in an  assisted living unit Self-affirm full right to decide which burdens to take on herself, both while in [place] and on nightly calls OK to limit number, duration, and direction of phone calls While visiting, full authority to enforce privacy At discretion, may call in local Adult Protective Services at any time if she feels either Jenny Thompson's or Jenny Thompson's welfare are at risk Possible friendly offering to [name] therapist, but preferably work openly with, and through, [employer] health care recommendations, especially as re diabetes care Maintain helpful social support at home, including as desired neighbors, church, others Al-Anon option Other recommendations/advice -- As may be noted above.  Continue to utilize previously learned skills ad lib. Medication compliance -- Maintain medication as prescribed and work faithfully with relevant prescriber(s) if any changes are desired or seem indicated. Crisis service -- Aware of call list and work-in appts.  Call the clinic on-call service, 988/hotline, 911, or present to [facility] or ER if any life-threatening psychiatric crisis. Followup -- Return for time as already scheduled.  Next scheduled visit with me 07/07/2023.  Next scheduled in this office 07/07/2023.  Robley Fries, PhD Jenny Czar, PhD LP Clinical Psychologist, [facility] [address], [address] [place], [place] [address] [phone]

## 2023-07-07 ENCOUNTER — Ambulatory Visit (INDEPENDENT_AMBULATORY_CARE_PROVIDER_SITE_OTHER): Payer: Medicare Other | Admitting: Psychiatry

## 2023-07-07 DIAGNOSIS — Z636 Dependent relative needing care at home: Secondary | ICD-10-CM

## 2023-07-07 DIAGNOSIS — F4323 Adjustment disorder with mixed anxiety and depressed mood: Secondary | ICD-10-CM | POA: Diagnosis not present

## 2023-07-07 DIAGNOSIS — F341 Dysthymic disorder: Secondary | ICD-10-CM | POA: Diagnosis not present

## 2023-07-07 NOTE — Progress Notes (Signed)
Psychotherapy Progress Note [facility] Marliss Czar, PhD LP  Patient ID: Jenny Thompson [facility])    MRN: [id number] Therapy format: Individual psychotherapy Date: 07/07/2023      Start: 2:10p     Stop: 3:00p     Time Spent: 50 min Location: In-person   Session narrative (presenting needs, interim history, self-report of stressors and symptoms, applications of prior therapy, status changes, and interventions made in session) Recent travel to see niece, put off some by how much complaining about her husband.  Continuing drama with Cherlynn Polo and foster niece [name], who has a plan afoot to buy [name] & [name] house and rescue them from the financial and emotional morass they got into with [name] striking nebulous, unenforceable deal with her housekeeper's daughter to rent to own the place.  Plan getting fouled up in attorney, missing information and making assumptions, and [name] avoiding her part and pretty much unable to manage details, and both parties trying to bend Kyilee's ear.  Heading tomorrow to [place], in hopes of informing [name] of a friend's death and maybe working through the impasse.  Leery of going, since the last time she was there she got blamed unfairly for instigating when she simply did not want to have to associate with th squatters or feel like she was at risk for getting walked in on, and left early, offended.  Since then, [name] has had some obliquely critical things to say to her, too, but the needs at hand take precedence.  Affirmed and encouraged in readying herself to hear some unfair things but get the job done anyway.    Took recommendation to get emergency numbers down for adult welfare, in case of a need to report [name] condition or ask for a wellness check.  Dos feel better for knowing how now.  Has bluntly declined a bid for [name] to make her the secondary POA for [name], a very unwanted surprise.  Able to let [name] know she does not want that responsibility, and it is unwise  at this distance even if she did, though it means enduring some more pouting and wounded statements from her sister.  Also apprehensive about [name] wandering into her bathroom and messing it up, as he is terribly incontinent now but still in the entitled habit of using whatever bathroom he thinks of, even if she is a Manufacturing systems engineer and it is dedicated to her.  Was going to bring a temporary lock of her own to put on the door, but thought better of it.  Discussed a range of tactics for coping and managing exposure, including practical measures (put tape on the door as an indicator, sandpaper on the doorknob as a tactile attention-getter) as well as ways to cope with humor (fantasize [name] is an alien).  Therapeutic modalities: Cognitive Behavioral Therapy, Solution-Oriented/Positive Psychology, and Ego-Supportive  Mental Status/Observations:  Appearance:   Casual     Behavior:  Appropriate  Motor:  Normal  Speech/Language:   Clear and Coherent  Affect:  Appropriate  Mood:  anxious  Thought process:  normal  Thought content:    WNL  Sensory/Perceptual disturbances:    WNL  Orientation:  Fully oriented  Attention:  Good    Concentration:  Good  Memory:  WNL  Insight:    Good  Judgment:   Good  Impulse Control:  Good   Risk Assessment: Danger to Self: No Self-injurious Behavior: No Danger to Others: No Physical Aggression / Violence: No Duty to Warn: No  Access to Firearms a concern: No  Assessment of progress:  progressing  Diagnosis:   ICD-10-CM   1. Adjustment disorder with mixed anxiety and depressed mood  F43.23     2. 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  Acknowledged [name] will still most likely  resist, but it established his best thoughts of being heard, and possibly acted on.  Other tactics for defeating manipulation and martyring, should [name] indulge those. Best for [name] to stop alcohol, but clear she won't.  Can recommend B complex to offset some of the damage, and supportively inquire after her self-care, including counseling. Best for [name] to assess and prepare for further assistance, either in-home or in an assisted living unit Self-affirm full right to decide which burdens to take on herself, both while in [place] and on nightly calls OK to limit number, duration, and direction of phone calls While visiting, full authority to enforce privacy At discretion, may call in local Adult Protective Services at any time if she feels either [name] or [name] welfare are at risk Possible friendly offering to [name] therapist, but preferably work openly with, and through, Camera operator health care recommendations, especially as re diabetes care Maintain helpful social support at home, including as desired neighbors, church, others Al-Anon option Other recommendations/advice -- As may be noted above.  Continue to utilize previously learned skills ad lib. Medication compliance -- Maintain medication as prescribed and work faithfully with relevant prescriber(s) if any changes are desired or seem indicated. Crisis service -- Aware of call list and work-in appts.  Call the clinic on-call service, 988/hotline, 911, or present to [facility] or ER if any life-threatening psychiatric crisis. Followup -- Return for time as available.  Next scheduled visit with me 08/09/2023.  Next scheduled in this office 08/09/2023.  Robley Fries, PhD Marliss Czar, PhD LP Clinical Psychologist, [facility] [address] [place], [place] [address] [phone]

## 2023-08-09 ENCOUNTER — Ambulatory Visit (INDEPENDENT_AMBULATORY_CARE_PROVIDER_SITE_OTHER): Payer: Medicare Other | Admitting: Psychiatry

## 2023-08-09 DIAGNOSIS — Z636 Dependent relative needing care at home: Secondary | ICD-10-CM

## 2023-08-09 DIAGNOSIS — F341 Dysthymic disorder: Secondary | ICD-10-CM

## 2023-08-09 DIAGNOSIS — F4323 Adjustment disorder with mixed anxiety and depressed mood: Secondary | ICD-10-CM

## 2023-08-09 NOTE — Progress Notes (Signed)
Psychotherapy Progress Note [facility] Marliss Czar, PhD LP  Patient ID: Jenny Thompson [facility])    MRN: [id number] Therapy format: Individual psychotherapy Date: 08/09/2023      Start: 3:14p     Stop: 4:02p     Time Spent: 48 min Location: In-person   Session narrative (presenting needs, interim history, self-report of stressors and symptoms, applications of prior therapy, status changes, and interventions made in session) Tiring of the nightly routine with Jenny Thompson, listening to her complain about the weather and the cats, muse about things she needs to do without much follow through, and extol the friends who have basically been squatters.  Did visit her in [place], and got along cordially with Jenny Thompson, unlike what Jenny Thompson had predicted and feared.  Soft signs of dementia, misnaming people, speaking vaguely, perseverating, and emotionally more dull.  Time spent is dulling, with Jenny Thompson drinking, smoking, working puzzles, and less interactive.  Discussed at length how she can respond to it and her motivations to either see less of her or try to find a sober opening to lobby her to take better care of herself.  Therapeutic modalities: Cognitive Behavioral Therapy, Solution-Oriented/Positive Psychology, Ego-Supportive, and Assertiveness/Communication  Mental Status/Observations:  Appearance:   Casual     Behavior:  Appropriate  Motor:  Normal  Speech/Language:   Clear and Coherent  Affect:  Appropriate  Mood:  normal  Thought process:  normal  Thought content:    WNL  Sensory/Perceptual disturbances:    WNL  Orientation:  Fully oriented  Attention:  Good    Concentration:  Good  Memory:  WNL  Insight:    Good  Judgment:   Good  Impulse Control:  Good   Risk Assessment: Danger to Self: No Self-injurious Behavior: No Danger to Others: No Physical Aggression / Violence: No Duty to Warn: No Access to Firearms a concern: No  Assessment of progress:  progressing  Diagnosis:    ICD-10-CM   1. Adjustment disorder with mixed anxiety and depressed mood  F43.23     2. 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  Jenny Thompson may still resist, but it establishes best chance of being heard, and possibly acted on.  Other tactics for defeating manipulation and martyring, should Jenny Thompson indulge those. Best for Jenny Thompson to stop alcohol, but clear she won't.  Can recommend B complex to offset some of the damage, and supportively inquire after her self-care, including counseling. Best for Jenny Thompson that they assess and prepare for further assistance, either in-home or in an assisted living unit Self-affirm full right to decide which burdens to take on herself, both while in [place] and on nightly calls OK to limit number, duration, and direction of phone calls While visiting, full authority to enforce privacy At discretion, may call in local Adult Protective Services any time she feels either Jenny Thompson's or Jenny Thompson's welfare are at risk Possible friendly offering to [name] therapist, but preferably work openly with, and through, Camera operator health care recommendations, especially as re diabetes care Maintain helpful social support at home, including as desired neighbors, church, others Al-Anon option Other recommendations/advice -- As may be noted above.  Continue to utilize previously learned skills ad lib. Medication compliance --  Maintain medication as prescribed and work faithfully with relevant prescriber(s) if any changes are desired or seem indicated. Crisis service -- Aware of call list and work-in appts.  Call the clinic on-call service, 988/hotline, 911, or present to [facility] or ER if any life-threatening psychiatric  crisis. Followup -- Return for time as already scheduled.  Next scheduled visit with me 09/06/2023.  Next scheduled in this office 09/06/2023.  Robley Fries, PhD Marliss Czar, PhD LP Clinical Psychologist, [facility] [address] [place], [place] [address] [phone]

## 2023-09-06 ENCOUNTER — Ambulatory Visit (INDEPENDENT_AMBULATORY_CARE_PROVIDER_SITE_OTHER): Payer: Medicare Other | Admitting: Psychiatry

## 2023-09-06 DIAGNOSIS — Z636 Dependent relative needing care at home: Secondary | ICD-10-CM | POA: Diagnosis not present

## 2023-09-06 DIAGNOSIS — F4323 Adjustment disorder with mixed anxiety and depressed mood: Secondary | ICD-10-CM | POA: Diagnosis not present

## 2023-09-06 DIAGNOSIS — F341 Dysthymic disorder: Secondary | ICD-10-CM

## 2023-09-06 NOTE — Progress Notes (Signed)
Psychotherapy Progress Note [facility] Marliss Czar, PhD LP  Patient ID: Jenny Thompson [facility])    MRN: [id number] Therapy format: Individual psychotherapy Date: 09/06/2023      Start: 2:10p     Stop: 3:00p     Time Spent: 50 min Location: In-person   Session narrative (presenting needs, interim history, self-report of stressors and symptoms, applications of prior therapy, status changes, and interventions made in session) Took a casual suggestion to write a "psalm" of her own pertaining to her yearning for, and struggle with, [name].  More of a prayer, really, but helpful and clarifying to write out.  Found her copy of Codependent No More and been rereading it.  [name] situation keeps getting harder, with [name] having explosive incontinence now and tedious cleaning, and a drain in his gall bladder with habit of messing with it and [name] still making excuses.  Now weighing a dilemma whether to go on a planned birthday trip to [place] with them. Last year was miserable, [name] is showing no recognition of how it would be again with him but sounds like she could not be convinced to arrange respite care.  Discussed motivations and values, resolved not to agree to another ill-fated [place] trip, make alternate birthday plans, possibly still go back to [place] for holiday, separately.  Glad for the friendship and support of [name], the "pseudo" niece (emotionally adopted, by M before any decisions of her own, hx of hurtful things overcome together).  Plans to buy out the [place] home to help save [name] from her own best efforts to sell herself out got complicated and may not happen.  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  Therapeutic modalities: Cognitive Behavioral Therapy, Solution-Oriented/Positive Psychology, and Ego-Supportive  Mental Status/Observations:  Appearance:   Casual     Behavior:  Appropriate  Motor:  Normal  Speech/Language:   Clear and Coherent  Affect:  Appropriate  Mood:  sober  Thought process:  normal  Thought content:    WNL  Sensory/Perceptual disturbances:    WNL  Orientation:  Fully oriented  Attention:  Good    Concentration:  Good  Memory:  WNL  Insight:    Good  Judgment:   Good  Impulse Control:  Good   Risk Assessment: Danger to Self: No Self-injurious Behavior: No Danger to Others: No Physical Aggression / Violence: No Duty to Warn: No Access to Firearms a concern: No  Assessment of progress:  progressing  Diagnosis:   ICD-10-CM   1. Adjustment disorder with mixed anxiety and depressed mood  F43.23     2. 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  [name] may still resist, but it establishes best chance of being heard, and possibly acted on.  Other tactics for defeating manipulation and martyring, should [name] indulge those. Best for [name] to stop alcohol, but clear she won't.  Can recommend B complex to  offset some of the damage, and supportively inquire after her self-care, including counseling. Best for [name] that they assess and prepare for further assistance, either in-home or in an assisted living unit Self-affirm full right to decide which burdens to take on herself, both while in [place] and on nightly calls OK to limit number, duration, and direction of phone calls While visiting, full authority to enforce privacy At discretion, may call in local Adult Protective Services any  time she feels either [name] or [name] welfare are at risk Possible friendly offering to [name] therapist, but preferably work openly with, and through, Camera operator health care recommendations, especially as re diabetes care Maintain helpful social support at home, including as desired neighbors, church, others Al-Anon option Other recommendations/advice -- As may be noted above.  Continue to utilize previously learned skills ad lib. Medication compliance -- Maintain medication as prescribed and work faithfully with relevant prescriber(s) if any changes are desired or seem indicated. Crisis service -- Aware of call list and work-in appts.  Call the clinic on-call service, 988/hotline, 911, or present to [facility] or ER if any life-threatening psychiatric crisis. Followup -- Return for time as already scheduled.  Next scheduled visit with me 10/03/2023.  Next scheduled in this office 10/03/2023.  Robley Fries, PhD Marliss Czar, PhD LP Clinical Psychologist, [facility] [address] [place], [place] [address] [phone]

## 2023-10-03 ENCOUNTER — Ambulatory Visit (INDEPENDENT_AMBULATORY_CARE_PROVIDER_SITE_OTHER): Payer: Medicare Other | Admitting: Psychiatry

## 2023-10-03 DIAGNOSIS — F341 Dysthymic disorder: Secondary | ICD-10-CM

## 2023-10-03 DIAGNOSIS — F4323 Adjustment disorder with mixed anxiety and depressed mood: Secondary | ICD-10-CM | POA: Diagnosis not present

## 2023-10-03 DIAGNOSIS — Z636 Dependent relative needing care at home: Secondary | ICD-10-CM | POA: Diagnosis not present

## 2023-10-03 NOTE — Progress Notes (Signed)
Psychotherapy Progress Note [facility] Marliss Czar, PhD LP  Patient ID: Jenny Thompson [facility])    MRN: [id number] Therapy format: Individual psychotherapy Date: 10/03/2023      Start: 11:09a     Stop: 11:59a     Time Spent: 50 min Location: In-person   Session narrative (presenting needs, interim history, self-report of stressors and symptoms, applications of prior therapy, status changes, and interventions made in session) Feeling better, stood her ground about not trying to make an ill-advised birthday outing to [place].  Summoned herself to tell [name] it sounds like she's slowly killing herself, being overextended the way she does, and drinking the way she does.  Discussed indications [name] may actually prefer her adoptive (codependent) family in [place] despite the protest Safiyyah is breaking her heart by not maintaining the tradition of coming down for her own birthday (and allowing [name] to make arrangements to her liking and treat herself in the process).  Content to make plans with herself this year, and to delay her usual, too-early start to the Christmas time stay in [place], and to let [name] speak up if she truly wants/needs her present.  Nightly, intoxicated phone calls continue, though the time commitment seems to be shortening as [name] local "family" tend to hang out longer.  To her credit, [name] has said she's "thought a lot about" the feedback that she's in a slow suicide with her lifestyle choices.  Acknowledges a habit in times past of buttering up [name] with compliments -- presumably in an attempt to curry favor, be accepted, or maybe manage her -- that took enough out of her, just trying to be more frank and realistic in this phase of life.  Considerable amount to look forward to with the neighbor family she's grafted into for some years now, including being able to attend band concerts.  More enjoyable days when the kids were smaller, pre-electronics, but trying to keep up  with what they like, even if she doesn't understand the appeal (anime characters, D&D) when it comes to Christmas gifts and such.  Therapeutic modalities: Cognitive Behavioral Therapy, Solution-Oriented/Positive Psychology, Ego-Supportive, and Humanistic/Existential  Mental Status/Observations:  Appearance:   Casual     Behavior:  Appropriate  Motor:  Normal  Speech/Language:   Clear and Coherent  Affect:  Appropriate  Mood:  normal  Thought process:  normal  Thought content:    WNL  Sensory/Perceptual disturbances:    WNL  Orientation:  Fully oriented  Attention:  Good    Concentration:  Good  Memory:  WNL  Insight:    Good  Judgment:   Good  Impulse Control:  Good   Risk Assessment: Danger to Self: No Self-injurious Behavior: No Danger to Others: No Physical Aggression / Violence: No Duty to Warn: No Access to Firearms a concern: No  Assessment of progress:  progressing  Diagnosis:   ICD-10-CM   1. Adjustment disorder with mixed anxiety and depressed mood  F43.23     2. 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  [name] may still resist, but it establishes best chance of being heard, and possibly acted on.  Other tactics for defeating manipulation and martyring, should [name] indulge those. Best  for [name] to stop alcohol, but clear she won't.  Can recommend B complex to offset some of the damage, and supportively inquire after her self-care, including counseling. Best for [name] status and for [name] ability to manage that they hire in-home assistance or start planning for an assisted living unit Self-affirm full right to decide which burdens to take on herself, both while in [place] and on nightly  calls OK to limit number, duration, and direction of phone calls OK to adjust and reduce travel plans and to make them more by appointment rather than offering whole, and for long stays While visiting, full authority to enforce privacy At discretion, may call in local Adult Protective Services any time she feels either [name] or [name] welfare are at risk Possible friendly offering to [name] therapist, but preferably work openly with, and through, Camera operator health care recommendations, especially re diabetes care Maintain helpful social support at home, including neighbor family, church, others Al-Anon option Other recommendations/advice -- As may be noted above.  Continue to utilize previously learned skills ad lib. Medication compliance -- Maintain medication as prescribed and work faithfully with relevant prescriber(s) if any changes are desired or seem indicated. Crisis service -- Aware of call list and work-in appts.  Call the clinic on-call service, 988/hotline, 911, or present to [facility] or ER if any life-threatening psychiatric crisis. Followup -- Return for time as already scheduled.  Next scheduled visit with me 10/31/2023.  Next scheduled in this office 10/31/2023.  Robley Fries, PhD Marliss Czar, PhD LP Clinical Psychologist, [facility] [address] [place], [place] [address] [phone]

## 2023-10-31 ENCOUNTER — Ambulatory Visit (INDEPENDENT_AMBULATORY_CARE_PROVIDER_SITE_OTHER): Payer: Medicare Other | Admitting: Psychiatry

## 2023-10-31 DIAGNOSIS — Z636 Dependent relative needing care at home: Secondary | ICD-10-CM

## 2023-10-31 DIAGNOSIS — F341 Dysthymic disorder: Secondary | ICD-10-CM | POA: Diagnosis not present

## 2023-10-31 DIAGNOSIS — F4323 Adjustment disorder with mixed anxiety and depressed mood: Secondary | ICD-10-CM

## 2023-10-31 NOTE — Progress Notes (Signed)
Psychotherapy Progress Note [facility] Marliss Czar, PhD LP  Patient ID: Jenny Thompson [facility])    MRN: [id number] Therapy format: Individual psychotherapy Date: 10/31/2023      Start: 11:11a     Stop: 11:58a     Time Spent: 47 min Location: In-person   Session narrative (presenting needs, interim history, self-report of stressors and symptoms, applications of prior therapy, status changes, and interventions made in session) Devastated with the election results, which to her signal an era of corruption, regression, and injustice to come and validation at the ballot box that bullying, bigotry, and class and gender warfare can be rewarded by the people.  More tangible concern for her neighbors and adoptive family, who could conceivably be deported after years of peaceful, productive living.  Embarrassed and dismayed at the country.  Not sure at this point she will live to see a kinder, improved country and a government that befits it.  Despondent, not suicidal.  Allowed to express fully and at length for comfort and validation, with brief brainstorming ways to put her feelings to use constructively and reinforced hope in others working for the same goals as she.  Therapeutic modalities: Cognitive Behavioral Therapy, Solution-Oriented/Positive Psychology, and Ego-Supportive  Mental Status/Observations:  Appearance:   Casual     Behavior:  Appropriate  Motor:  Normal  Speech/Language:   Clear and Coherent  Affect:  Appropriate  Mood:  sad  Thought process:  normal  Thought content:    WNL  Sensory/Perceptual disturbances:    WNL  Orientation:  Fully oriented  Attention:  Good    Concentration:  Good  Memory:  WNL  Insight:    Good  Judgment:   Good  Impulse Control:  Good   Risk Assessment: Danger to Self: No Self-injurious Behavior: No Danger to Others: No Physical Aggression / Violence: No Duty to Warn: No Access to Firearms a concern: No  Assessment of  progress:  stabilized  Diagnosis:   ICD-10-CM   1. Adjustment disorder with mixed anxiety and depressed mood  F43.23     2. 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  [name] may still resist, but it establishes best chance of being heard, and possibly acted on.  Other tactics for defeating manipulation and martyring, should [name] indulge those. Best for [name] to stop alcohol, but clear she won't.  Can recommend B complex to offset some of the damage, and supportively inquire after her self-care, including counseling. Best for [name] status and for [name] ability to manage that they hire in-home assistance or start planning for an assisted living unit Self-affirm full right to decide which burdens to take on herself, both while in [place] and on nightly calls OK to limit number, duration, and direction of phone calls OK to adjust and reduce travel plans and to make them more by appointment rather than offering whole, and for long stays While visiting, full authority to enforce privacy At discretion, may call in local Adult Protective Services any time she feels either [name] or [name] welfare are at risk Possible friendly offering to [name] therapist, but preferably work openly with, and through, [name] Self-care for stress and depression Maintain health care recommendations, especially re  diabetes care Maintain helpful social support at home, including neighbor family, church, others Al-Anon option For political stress, options to get involved with political action as she sees fit, learn about and imagine others working for same values as she Other recommendations/advice -- As may be noted above.  Continue to utilize  previously learned skills ad lib. Medication compliance -- Maintain medication as prescribed and work faithfully with relevant prescriber(s) if any changes are desired or seem indicated. Crisis service -- Aware of call list and work-in appts.  Call the clinic on-call service, 988/hotline, 911, or present to [facility] or ER if any life-threatening psychiatric crisis. Followup -- Return for time as already scheduled.  Next scheduled visit with me 11/28/2023.  Next scheduled in this office 11/28/2023.  Robley Fries, PhD Marliss Czar, PhD LP Clinical Psychologist, [facility] [address] [place], [place] [address] [phone]

## 2023-11-28 ENCOUNTER — Ambulatory Visit (INDEPENDENT_AMBULATORY_CARE_PROVIDER_SITE_OTHER): Payer: Medicare Other | Admitting: Psychiatry

## 2023-11-28 DIAGNOSIS — Z636 Dependent relative needing care at home: Secondary | ICD-10-CM | POA: Diagnosis not present

## 2023-11-28 DIAGNOSIS — F4323 Adjustment disorder with mixed anxiety and depressed mood: Secondary | ICD-10-CM

## 2023-11-28 DIAGNOSIS — F341 Dysthymic disorder: Secondary | ICD-10-CM | POA: Diagnosis not present

## 2023-11-28 NOTE — Progress Notes (Signed)
Psychotherapy Progress Note [facility] Marliss Czar, PhD LP  Patient ID: Jenny Thompson [facility])    MRN: [id number] Therapy format: Individual psychotherapy Date: 11/28/2023      Start: 2:24p     Stop: 3:10p     Time Spent: 46 min Location: In-person   Session narrative (presenting needs, interim history, self-report of stressors and symptoms, applications of prior therapy, status changes, and interventions made in session) Further deterioration for [name], and further self-delusion by [name] that she can handle his confusion and incontinence, on her own or with so-far hypothetical personal care service.  Watching [name] wear herself down, has taken the chance to mention, "There's more than one way people commit suicide."  Believes she did get her attention, but [name] ability to deny and repress is long practiced tenacious, and she harbors a sense of loyalty to her perceived duty that just will not be satisfied with anything less than keeping up the fiction that she can keep up.  Support/empathy provided.   Has a nephrology appt coming up, knows she's doing worse for kidney function.  Discussed   Continues dismayed by [employer] and predictions of chaos and unnecessary pain with the new administration.  Does not believe she will live to see the end of it.  Support/empathy provided and encouraged that whatever she cannot realistically affect, there are other people with the will and energy to pursue the society she believes in, if she'd like to share anything with them to combat the sense of helplessness.  Therapeutic modalities: Cognitive Behavioral Therapy, Solution-Oriented/Positive Psychology, Ego-Supportive, Humanistic/Existential, Faith-sensitive, and Assertiveness/Communication  Mental Status/Observations:  Appearance:   Casual     Behavior:  Appropriate  Motor:  Normal  Speech/Language:   Clear and Coherent  Affect:  Appropriate  Mood:  dysthymic  Thought process:   normal  Thought content:    WNL  Sensory/Perceptual disturbances:    WNL  Orientation:  Fully oriented  Attention:  Good    Concentration:  Good  Memory:  WNL  Insight:    Good  Judgment:   Good  Impulse Control:  Good   Risk Assessment: Danger to Self: No Self-injurious Behavior: No Danger to Others: No Physical Aggression / Violence: No Duty to Warn: No Access to Firearms a concern: No  Assessment of progress:  stabilized  Diagnosis:   ICD-10-CM   1. Adjustment disorder with mixed anxiety and depressed mood  F43.23     2. 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  [name] may still resist, but it establishes best chance of being heard, and possibly acted on.  Other tactics for defeating manipulation and martyring, should [name] indulge those. Best for [name] to stop alcohol, but clear she won't.  Can recommend B complex to offset some of the damage, and supportively inquire after her self-care, including counseling. Best for [name] status and for [name] ability to manage that they hire in-home assistance or start planning for an assisted living unit Self-affirm full right to decide which burdens to take on herself, both while in [place] and on nightly calls OK to limit number, duration, and direction of phone calls OK to adjust and reduce travel plans and to make them  more by appointment rather than offering whole, and for long stays While visiting, full authority to enforce privacy At discretion, may call in local Adult Protective Services any time she feels either [name] or [name] welfare are at risk Possible friendly offering to [name] therapist, but preferably work openly with, and through, [name] Self-care for  stress and depression Maintain health care recommendations, especially re diabetes care Maintain helpful social support at home, including neighbor family, church, others Al-Anon option For political and existential stress, options to get involved with political action as she sees fit, learn about and imagine others working for same values as she, press into her faith Other recommendations/advice -- As may be noted above.  Continue to utilize previously learned skills ad lib. Medication compliance -- Maintain medication as prescribed and work faithfully with relevant prescriber(s) if any changes are desired or seem indicated. Crisis service -- Aware of call list and work-in appts.  Call the clinic on-call service, 988/hotline, 911, or present to [facility] or ER if any life-threatening psychiatric crisis. Followup -- No follow-ups on file.  Next scheduled visit with me 12/27/2023.  Next scheduled in this office 12/27/2023.  Robley Fries, PhD Marliss Czar, PhD LP Clinical Psychologist, [facility] [address], [address] [place], [place] [address] [phone]

## 2023-12-27 ENCOUNTER — Ambulatory Visit (INDEPENDENT_AMBULATORY_CARE_PROVIDER_SITE_OTHER): Payer: Medicare Other | Admitting: Psychiatry

## 2023-12-27 DIAGNOSIS — F341 Dysthymic disorder: Secondary | ICD-10-CM | POA: Diagnosis not present

## 2023-12-27 DIAGNOSIS — Z636 Dependent relative needing care at home: Secondary | ICD-10-CM

## 2023-12-27 DIAGNOSIS — F4323 Adjustment disorder with mixed anxiety and depressed mood: Secondary | ICD-10-CM | POA: Diagnosis not present
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
## 2024-01-24 ENCOUNTER — Ambulatory Visit (INDEPENDENT_AMBULATORY_CARE_PROVIDER_SITE_OTHER): Payer: Medicare Other | Admitting: Psychiatry

## 2024-01-24 DIAGNOSIS — F4323 Adjustment disorder with mixed anxiety and depressed mood: Secondary | ICD-10-CM | POA: Diagnosis not present

## 2024-01-24 DIAGNOSIS — Z636 Dependent relative needing care at home: Secondary | ICD-10-CM | POA: Diagnosis not present

## 2024-01-24 DIAGNOSIS — F341 Dysthymic disorder: Secondary | ICD-10-CM | POA: Diagnosis not present
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
## 2024-02-29 ENCOUNTER — Ambulatory Visit (INDEPENDENT_AMBULATORY_CARE_PROVIDER_SITE_OTHER): Payer: Medicare Other | Admitting: Psychiatry

## 2024-02-29 DIAGNOSIS — F341 Dysthymic disorder: Secondary | ICD-10-CM

## 2024-02-29 DIAGNOSIS — F4323 Adjustment disorder with mixed anxiety and depressed mood: Secondary | ICD-10-CM | POA: Diagnosis not present

## 2024-02-29 DIAGNOSIS — Z636 Dependent relative needing care at home: Secondary | ICD-10-CM

## 2024-02-29 NOTE — Progress Notes (Signed)
 Psychotherapy Progress Note [facility] Delora Ferry, PhD LP  Patient ID: Jenny Thompson [facility])    MRN: [id number] Therapy format: Individual psychotherapy Date: 02/29/2024      Start: 2:08p     Stop: 2:58p     Time Spent: 50 min Location: In-person   Session narrative (presenting needs, interim history, self-report of stressors and symptoms, applications of prior therapy, status changes, and interventions made in session) Just back from [place], in [place].  Hinton Luis was hospitalized, as noted, wound up losing another toe then going to nursing home rehab.  [name] kept vigil at both institutions, and noticed when Hinton Luis developed the same tremor he had with sepsis -- nurse assessed and had him evacuated to the local ER, he coded, then transported to trauma center in [place] just 2 days before Archer traveled down.  In ICU 8 days, where she witnessed superb care, customer relations, and organization that got him connected to palliative care and chaplain service.  Could not speak more highly of that service.  Then a hard step down going to a regular room, with the dropoff in attention and customer service, and the noticeable disgust that nursing staff seemed to portray at that level.    Further worry for Jerryl Morin, who smoked compulsively and showed her own mental status issues, mistaking dreams for waking events, and being sure (a la dementia) that visits and procedures had not happened, when in fact she slept through them.  Currently Hinton Luis is back in the rehab center, so Jerryl Morin is effectively forced to see how much better it can be, allowing ample help and facility living.  Proud of her so far for not setting unrealistic expectations or bringing him home AMA.  Heartened again to see how rehab staff are very informative and dedicated, too.    Meanwhile, [name] housekeeper creating new drama -- lost her other job when she failed to report not coming in (due to [place] new [place]   policies[place]  Calling off work for [employer], too, for more and more pedestrian reasons, but it's clear that Jerryl Morin created this susceptibility to being used by being super soft about requirements.    Glad to have pulled off [name] birthday as intended, despite the issues encountered, and cheered a bit by seeing her drink less (by necessity).  Fair chance now it may be enough for Jerryl Morin to admit that she is not capable of being [name] full time caregiver with needs as they have become, and that she may find friends, enjoyable activities, and even visitors.  Support/validation provided.   Therapeutic modalities: Cognitive Behavioral Therapy, Solution-Oriented/Positive Psychology, and Ego-Supportive  Mental Status/Observations:  Appearance:   Casual     Behavior:  Appropriate  Motor:  Normal  Speech/Language:   Clear and Coherent  Affect:  Appropriate  Mood:  Cautiously optimistic  Thought process:  normal  Thought content:    WNL  Sensory/Perceptual disturbances:    WNL  Orientation:  Fully oriented  Attention:  Good    Concentration:  Good  Memory:  WNL  Insight:    Good  Judgment:   Good  Impulse Control:  Good   Risk Assessment: Danger to Self: No Self-injurious Behavior: No Danger to Others: No Physical Aggression / Violence: No Duty to Warn: No Access to Firearms a concern: No  Assessment of progress:  progressing  Diagnosis:   ICD-10-CM   1. Adjustment disorder with mixed anxiety and depressed mood  F43.23  2. 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  Jerryl Morin may still resist, but it establishes best chance of being heard, and possibly acted on.  Other tactics for defeating manipulation and martyring,  should Jerryl Morin indulge those. Best for Jerryl Morin to stop alcohol, but clear she won't.  Can recommend B complex to offset some of the damage, and supportively inquire after her self-care, including counseling. Best for [name] status and for [name] ability to manage that they hire in-home assistance or start planning for an assisted living unit Self-affirm full right to decide which burdens to take on herself, both while in [place]  and on nightly calls OK to limit number, duration, and direction of phone calls OK to adjust and reduce travel plans and to make them more by appointment rather than offering whole, and for long stays While visiting, full authority to enforce privacy At discretion, may call in local Adult Protective Services any time she feels either [name] or [name] welfare are at risk Possible friendly offering to [name] therapist, but preferably work openly with, and through, [name], newspaper Self-care for stress and depression Maintain health care recommendations, especially re diabetes care Maintain helpful social support at home, including neighbor family, church, others Al-Anon option For political and existential stress, options to get involved with political action as she sees fit, learn about and imagine others working for same values as she, press into her faith Other recommendations/advice -- As may be noted above.  Continue to utilize previously learned skills ad lib. Medication compliance -- Maintain medication as prescribed and work faithfully with relevant prescriber(s) if any changes are desired or seem indicated. Crisis service -- Aware of call list and work-in appts.  Call the clinic on-call service, 988/hotline, 911, or present to [facility] or ER if any life-threatening psychiatric crisis. Followup -- Return for time as already scheduled.  Next scheduled visit with me 03/21/2024.  Next scheduled in this office 03/21/2024.  Maretta Shaper, PhD Delora Ferry, PhD LP Clinical Psychologist, [facility] [address] [place], [place] [address] [phone]

## 2024-03-21 ENCOUNTER — Ambulatory Visit (INDEPENDENT_AMBULATORY_CARE_PROVIDER_SITE_OTHER): Payer: Medicare Other | Admitting: Psychiatry

## 2024-03-21 DIAGNOSIS — F341 Dysthymic disorder: Secondary | ICD-10-CM

## 2024-03-21 DIAGNOSIS — Z636 Dependent relative needing care at home: Secondary | ICD-10-CM

## 2024-03-21 DIAGNOSIS — F4323 Adjustment disorder with mixed anxiety and depressed mood: Secondary | ICD-10-CM | POA: Diagnosis not present

## 2024-03-21 NOTE — Progress Notes (Unsigned)
 Psychotherapy Progress Note [facility] Delora Ferry, PhD LP  Patient ID: Grae Leathers [facility])    MRN: [id number] Therapy format: Individual psychotherapy Date: 03/21/2024      Start: 3:14p     Stop: 4:04p     Time Spent: 50 min Location: In-person   Session narrative (presenting needs, interim history, self-report of stressors and symptoms, applications of prior therapy, status changes, and interventions made in session) Health care visits coming up.  Optimistic about kidneys, knows she scores better with when she's normally hydrated, vs what would otherwise be concerning readings.  Cardio is more problematic -- Feb 13 had an echo that showed "severe" ventricular hypertrophy, but she can't get any intelligible feedback and it's been 7 weeks or so.  Nurse's message to keep her appointment and take her meds, when she had left a message about being confused and alarmed, came across abrasive.  Still intends to switch cardiologist to the one recommended.  Commended positive expectations for that.  Jerryl Morin and [name] situation improving as he is stably in nursing home rehab.  He is showing some spontaneous moments of doing something for himself, actually, like use a spoon or cup, moving his leges on his own to get up from the bed, and even asking about their finances.  Jerryl Morin remains conflicted, compulsive, and trying to keep up daily visits  Therapeutic modalities: {AM:23362::"Cognitive Behavioral Therapy","Solution-Oriented/Positive Psychology"}  Mental Status/Observations:  Appearance:   {PSY:22683}     Behavior:  {PSY:21022743}  Motor:  {PSY:22302}  Speech/Language:   {PSY:22685}  Affect:  {PSY:22687}  Mood:  {PSY:31886}  Thought process:  {PSY:31888}  Thought content:    {PSY:[phone]}  Sensory/Perceptual disturbances:    {PSY:606-864-1015}  Orientation:  {Psych Orientation:23301::"Fully oriented"}  Attention:  {Good-Fair-Poor ratings:23770::"Good"}    Concentration:   {Good-Fair-Poor ratings:23770::"Good"}  Memory:  {PSY:336-030-5489}  Insight:    {Good-Fair-Poor ratings:23770::"Good"}  Judgment:   {Good-Fair-Poor ratings:23770::"Good"}  Impulse Control:  {Good-Fair-Poor ratings:23770::"Good"}   Risk Assessment: Danger to Self: {Risk:22599::"No"} Self-injurious Behavior: {Risk:22599::"No"} Danger to Others: {Risk:22599::"No"} Physical Aggression / Violence: {Risk:22599::"No"} Duty to Warn: {AMYesNo:22526::"No"} Access to Firearms a concern: {AMYesNo:22526::"No"}  Assessment of progress:  {Progress:22147::"progressing"}  Diagnosis:   ICD-10-CM   1. Adjustment disorder with mixed anxiety and depressed mood  F43.23     2. 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  Jerryl Morin may still resist, but it establishes best chance of being heard, and possibly acted on.  Other tactics for defeating manipulation and martyring, should Jerryl Morin indulge those. Best for Jerryl Morin to stop alcohol, but clear she won't.  Can recommend B complex to offset some of the damage, and supportively inquire after her self-care, including counseling. Best for [name] status and for [name] ability to manage that they hire in-home assistance or start planning for an assisted living unit Self-affirm full right to decide which burdens to take on herself, both while in [place]  and on nightly calls OK to limit number, duration, and direction of phone calls OK to adjust and reduce travel plans and to make them more by appointment rather than offering whole, and for long stays While visiting, full authority to enforce privacy At discretion, may call in local Adult Protective Services any time she  feels either [name] or [name] welfare  are at risk Possible friendly offering to [name] therapist, but preferably work openly with, and through, [name], newspaper Self-care for stress and depression Maintain health care recommendations, especially re diabetes care Maintain helpful social support at home, including neighbor family, church, others Al-Anon option For political and existential stress, options to get involved with political action as she sees fit, learn about and imagine others working for same values as she, press into her faith Other recommendations/advice -- As may be noted above.  Continue to utilize previously learned skills ad lib. Medication compliance -- Maintain medication as prescribed and work faithfully with relevant prescriber(s) if any changes are desired or seem indicated. Crisis service -- Aware of call list and work-in appts.  Call the clinic on-call service, 988/hotline, 911, or present to [facility] or ER if any life-threatening psychiatric crisis. Followup -- Return for time as already scheduled.  Next scheduled visit with me 04/16/2024.  Next scheduled in this office 04/16/2024.  Maretta Shaper, PhD Delora Ferry, PhD LP Clinical Psychologist, [facility] [address] [place], [place] [address] [phone]

## 2024-04-16 ENCOUNTER — Ambulatory Visit (INDEPENDENT_AMBULATORY_CARE_PROVIDER_SITE_OTHER): Payer: Federal, State, Local not specified - PPO | Admitting: Psychiatry

## 2024-04-16 DIAGNOSIS — F341 Dysthymic disorder: Secondary | ICD-10-CM | POA: Diagnosis not present

## 2024-04-16 DIAGNOSIS — Z636 Dependent relative needing care at home: Secondary | ICD-10-CM

## 2024-04-16 DIAGNOSIS — F4323 Adjustment disorder with mixed anxiety and depressed mood: Secondary | ICD-10-CM

## 2024-04-16 NOTE — Progress Notes (Unsigned)
 Psychotherapy Progress Note [facility] Delora Ferry, PhD LP  Patient ID: Jenny Thompson [facility])    MRN: [id number] Therapy format: Individual psychotherapy Date: 04/16/2024      Start: 2:05p     Stop: 2:53p     Time Spent: 48 min Location: In-person   Session narrative (presenting needs, interim history, self-report of stressors and symptoms, applications of prior therapy, status changes, and interventions made in session) Discussed orthopedic situations -- possible frozen shoulder, for one, endorsed proper medical attention and encouraging news that antiinflammatory injections can do as much or more durable good than a painful course of physical therapy by itself too soon.  She has lingering issues with a necrotic bone as well.  Did get appt with cardiologist in [place], Dr. Lafayette Pierre, thankful for the referral, trusts it's going to make a satisfactory ending to the dissatisfying story with her [facility] cardiologist.    [name] hearing that Jenny Thompson had a breakthrough yesterday, not going to nursing home nor making sure someone else went in her place.  May bode well for her breaking her compulsive caretaking and self0-neglect habits, though it is prompted by a respiratory illness of some kind.  She has been sleeping poorly, and she has told her therapist allegedly, but no word of what to do about that.  Story for a long time has been that she needs her evening alcohol to sleep, suggesting all the more that she is physiologically tolerant and misreading its effects.  Also to the better is that Jenny Thompson has recently allowed some meals to be sent to her while visiting the nursing home.  She is getting more negative on the phone calls, however, more prone to complain about a range of people and tell repetitive stories.  Support/validation provided.   Therapeutic modalities: {AM:23362::"Cognitive Behavioral Therapy","Solution-Oriented/Positive Psychology"}  Mental Status/Observations:  Appearance:    {PSY:22683}     Behavior:  {PSY:21022743}  Motor:  {PSY:22302}  Speech/Language:   {PSY:22685}  Affect:  {PSY:22687}  Mood:  {PSY:31886}  Thought process:  {PSY:31888}  Thought content:    {[phone]}  Sensory/Perceptual disturbances:    {PSY:613-616-0868}  Orientation:  {Psych Orientation:23301::"Fully oriented"}  Attention:  {Good-Fair-Poor ratings:23770::"Good"}    Concentration:  {Good-Fair-Poor ratings:23770::"Good"}  Memory:  {PSY:848-500-7922}  Insight:    {Good-Fair-Poor ratings:23770::"Good"}  Judgment:   {Good-Fair-Poor ratings:23770::"Good"}  Impulse Control:  {Good-Fair-Poor ratings:23770::"Good"}   Risk Assessment: Danger to Self: {Risk:22599::"No"} Self-injurious Behavior: {Risk:22599::"No"} Danger to Others: {Risk:22599::"No"} Physical Aggression / Violence: {Risk:22599::"No"} Duty to Warn: {AMYesNo:22526::"No"} Access to Firearms a concern: {AMYesNo:22526::"No"}  Assessment of progress:  {Progress:22147::"progressing"}  Diagnosis:   ICD-10-CM   1. Adjustment disorder with mixed anxiety and depressed mood  F43.23     2. 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  Jenny Thompson may still resist, but it establishes best chance of being heard, and possibly acted on.  Other tactics for defeating manipulation and martyring, should Jenny Thompson indulge those. Best for Jenny Thompson to stop alcohol, but clear she won't.  Can recommend B complex to offset some of the damage, and supportively inquire after her self-care, including counseling. Best for Jenny Thompson's status and for Jenny Thompson ability to manage that they hire in-home assistance or start planning for an assisted living  unit Self-affirm full right to decide which burdens to  take on herself, both while in [place]  and on nightly calls OK to limit number, duration, and direction of phone calls OK to adjust and reduce travel plans and to make them more by appointment rather than offering whole, and for long stays While visiting, full authority to enforce privacy At discretion, may call in local Adult Protective Services any time she feels either Jenny Thompson or Jenny Thompson's welfare are at risk Possible friendly offering to [name] therapist, but preferably work openly with, and through, [name], newspaper Self-care for stress and depression Maintain health care recommendations, especially re diabetes care Maintain helpful social support at home, including neighbor family, church, others Al-Anon option For political and existential stress, options to get involved with political action as she sees fit, learn about and imagine others working for same values as she, press into her faith Other recommendations/advice -- As may be noted above.  Continue to utilize previously learned skills ad lib. Medication compliance -- Maintain medication as prescribed and work faithfully with relevant prescriber(s) if any changes are desired or seem indicated. Crisis service -- Aware of call list and work-in appts.  Call the clinic on-call service, 988/hotline, 911, or present to [facility] or ER if any life-threatening psychiatric crisis. Followup -- No follow-ups on file.  Next scheduled visit with me 05/16/2024.  Next scheduled in this office 05/16/2024.  Maretta Shaper, PhD Delora Ferry, PhD LP Clinical Psychologist, [facility] [address] [place], [place] [address] [phone]

## 2024-05-16 ENCOUNTER — Ambulatory Visit: Payer: Medicare Other | Admitting: Psychiatry

## 2024-05-16 DIAGNOSIS — Z636 Dependent relative needing care at home: Secondary | ICD-10-CM

## 2024-05-16 DIAGNOSIS — F4323 Adjustment disorder with mixed anxiety and depressed mood: Secondary | ICD-10-CM | POA: Diagnosis not present

## 2024-05-16 DIAGNOSIS — F341 Dysthymic disorder: Secondary | ICD-10-CM

## 2024-05-16 NOTE — Progress Notes (Incomplete)
 Psychotherapy Progress Note [facility] Delora Ferry, PhD LP  Patient ID: Jenny Thompson [facility])    MRN: [id number] Therapy format: Individual psychotherapy Date: 05/16/2024      Start: 3:15p     Stop: ***:***     Time Spent: *** min Location: In-person   Session narrative (presenting needs, interim history, self-report of stressors and symptoms, applications of prior therapy, status changes, and interventions made in session) More concerned with Jenny Thompson putting too much on herself, getting a respiratory infection, negelcting herself, and laughing off being told she needs to care for herself.  Found out she was in persistent diarrhea on an antibiotic and not doing anything about it but use Depends, turned out to be C diff.  Frustrated with how oblivious she is to care needs and to how much she burdens those who love her.  Took a call from her that seems to have been delirious.  Eventually took advice to go to ED, and they kept her.  Still got herself driven to Jenny Thompson's nursing home the day after discharge, and communications errors are ensuing about Jenny Thompson's care.  Meanwhile, Jenny Thompson has confabulated some, at least while in acute dehydration and c. diff.  Jenny Thompson's condition is that he's been called as terminal, although considering a foot amputation that might extend his life.  Jenny Thompson has resolved to go to [facility] for 2 wks starting next week.    Therapeutic modalities: {AM:23362::"Cognitive Behavioral Therapy","Solution-Oriented/Positive Psychology"}  Mental Status/Observations:  Appearance:   {PSY:22683}     Behavior:  {PSY:21022743}  Motor:  {PSY:22302}  Speech/Language:   {PSY:22685}  Affect:  {PSY:22687}  Mood:  {PSY:31886}  Thought process:  {PSY:31888}  Thought content:    {PSY:224-387-1187}  Sensory/Perceptual disturbances:    {PSY:775-443-6881}  Orientation:  {Psych Orientation:23301::"Fully oriented"}  Attention:  {Good-Fair-Poor ratings:23770::"Good"}    Concentration:   {Good-Fair-Poor ratings:23770::"Good"}  Memory:  {PSY:559-776-3790}  Insight:    {Good-Fair-Poor ratings:23770::"Good"}  Judgment:   {Good-Fair-Poor ratings:23770::"Good"}  Impulse Control:  {Good-Fair-Poor ratings:23770::"Good"}   Risk Assessment: Danger to Self: {Risk:22599::"No"} Self-injurious Behavior: {Risk:22599::"No"} Danger to Others: {Risk:22599::"No"} Physical Aggression / Violence: {Risk:22599::"No"} Duty to Warn: {AMYesNo:22526::"No"} Access to Firearms a concern: {AMYesNo:22526::"No"}  Assessment of progress:  {Progress:22147::"progressing"}  Diagnosis: No diagnosis found. Plan:  Boundaries with Jenny Thompson care & support May continue to listen supportively, at her discretion, but when needed, recommend assertiveness procedure (get agreement to listen, make observations, get agreement to listen to a recommendation, then offer).  Jenny Thompson may still resist, but it establishes best chance of being heard, and possibly acted on.  Other tactics for defeating manipulation and martyring, should Jenny Thompson indulge those. Best for Jenny Thompson to stop alcohol, but clear she won't.  Can recommend B complex to offset some of the damage, and supportively inquire after her self-care, including counseling. Best for Jenny Thompson's status and for Jenny Thompson ability to manage that they hire in-home assistance or start planning for an assisted living unit Self-affirm full right to decide which burdens to take on herself, both while in [place]  and on nightly calls OK to limit number, duration, and direction of phone calls OK to adjust and reduce travel plans and to make them more by appointment rather than offering whole, and for long stays While visiting, full authority to enforce privacy At discretion, may call in local Adult Protective Services any time she feels either Jenny Thompson or Jenny Thompson's welfare are at risk Possible friendly offering to [name] therapist, but preferably work openly with, and through, Jenny Thompson Self-care for stress and  depression Maintain health care recommendations, especially re diabetes care Maintain helpful social support at home, including neighbor family, church, others Al-Anon option For political and existential stress, options to get involved with political action as she sees fit, learn about and imagine others working for same values as she, press into her faith Other recommendations/advice -- As may be noted above.  Continue to utilize previously learned skills ad lib. Medication compliance -- Maintain medication as prescribed and work faithfully with relevant prescriber(s) if any changes are desired or seem indicated. Crisis service -- Aware of call list and work-in appts.  Call the clinic on-call service, 988/hotline, 911, or present to [facility] or ER if any life-threatening psychiatric crisis. Followup -- No follow-ups on file.  Next scheduled visit with me 06/12/2024.  Next scheduled in this office 06/12/2024.  Maretta Shaper, PhD Delora Ferry, PhD LP Clinical Psychologist, [facility] [address], [address] [place], [place] [address] [phone]

## 2024-05-16 NOTE — Progress Notes (Incomplete)
 Psychotherapy Progress Note [facility] Delora Ferry, PhD LP  Patient ID: Jenny Thompson [facility])    MRN: [id number] Therapy format: Individual psychotherapy Date: 04/16/2024      Start: 2:05p     Stop: 2:53p     Time Spent: 48 min Location: In-person   Session narrative (presenting needs, interim history, self-report of stressors and symptoms, applications of prior therapy, status changes, and interventions made in session) Discussed orthopedic situations -- possible frozen shoulder, for one, endorsed proper medical attention and encouraging news that antiinflammatory injections can do as much or more durable good than a painful course of physical therapy by itself too soon.  She has lingering issues with a necrotic bone as well.  Did get appt with cardiologist in [place], Dr. Lafayette Pierre, thankful for the referral, trusts it's going to make a satisfactory ending to the dissatisfying story with her [facility] cardiologist.    [name] hearing that Jerryl Morin had a breakthrough yesterday, not going to nursing home nor making sure someone else went in her place.  May bode well for her breaking her compulsive caretaking and self0-neglect habits, though it is prompted by a respiratory illness of some kind.  She has been sleeping poorly, and she has told her therapist allegedly, but no word of what to do about that.  Story for a long time has been that she needs her evening alcohol to sleep, suggesting all the more that she is physiologically tolerant and misreading its effects.  Also to the better is that Jerryl Morin has recently allowed some meals to be sent to her while visiting the nursing home.  She is getting more negative on the phone calls, however, more prone to complain about a range of people and tell repetitive stories.  Support/validation provided.   Therapeutic modalities: {AM:23362::"Cognitive Behavioral Therapy","Solution-Oriented/Positive Psychology"}  Mental Status/Observations:  Appearance:    {PSY:22683}     Behavior:  {PSY:21022743}  Motor:  {PSY:22302}  Speech/Language:   {PSY:22685}  Affect:  {PSY:22687}  Mood:  {PSY:31886}  Thought process:  {PSY:31888}  Thought content:    {PSY:[phone]}  Sensory/Perceptual disturbances:    {[phone]}  Orientation:  {Psych Orientation:23301::"Fully oriented"}  Attention:  {Good-Fair-Poor ratings:23770::"Good"}    Concentration:  {Good-Fair-Poor ratings:23770::"Good"}  Memory:  {PSY:[phone]}  Insight:    {Good-Fair-Poor ratings:23770::"Good"}  Judgment:   {Good-Fair-Poor ratings:23770::"Good"}  Impulse Control:  {Good-Fair-Poor ratings:23770::"Good"}   Risk Assessment: Danger to Self: {Risk:22599::"No"} Self-injurious Behavior: {Risk:22599::"No"} Danger to Others: {Risk:22599::"No"} Physical Aggression / Violence: {Risk:22599::"No"} Duty to Warn: {AMYesNo:22526::"No"} Access to Firearms a concern: {AMYesNo:22526::"No"}  Assessment of progress:  {Progress:22147::"progressing"}  Diagnosis:   ICD-10-CM   1. Adjustment disorder with mixed anxiety and depressed mood  F43.23     2. 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  Jerryl Morin may still resist, but it establishes best chance of being heard, and possibly acted on.  Other tactics for defeating manipulation and martyring, should Jerryl Morin indulge those. Best for Jerryl Morin to stop alcohol, but clear she won't.  Can recommend B complex to offset some of the damage, and supportively inquire after her self-care, including counseling. Best for [name] status and for [name] ability to manage that they hire in-home assistance or start planning for an assisted living  unit Self-affirm full right to decide which burdens to  take on herself, both while in [place]  and on nightly calls OK to limit number, duration, and direction of phone calls OK to adjust and reduce travel plans and to make them more by appointment rather than offering whole, and for long stays While visiting, full authority to enforce privacy At discretion, may call in local Adult Protective Services any time she feels either [name] or [name] welfare are at risk Possible friendly offering to [name] therapist, but preferably work openly with, and through, [name], newspaper Self-care for stress and depression Maintain health care recommendations, especially re diabetes care Maintain helpful social support at home, including neighbor family, church, others Al-Anon option For political and existential stress, options to get involved with political action as she sees fit, learn about and imagine others working for same values as she, press into her faith Other recommendations/advice -- As may be noted above.  Continue to utilize previously learned skills ad lib. Medication compliance -- Maintain medication as prescribed and work faithfully with relevant prescriber(s) if any changes are desired or seem indicated. Crisis service -- Aware of call list and work-in appts.  Call the clinic on-call service, 988/hotline, 911, or present to [facility] or ER if any life-threatening psychiatric crisis. Followup -- Return for time as already scheduled.  Next scheduled visit with me 05/16/2024.  Next scheduled in this office 05/16/2024.  Maretta Shaper, PhD Delora Ferry, PhD LP Clinical Psychologist, [facility] [address] [place], [place] [address] [phone]

## 2024-05-16 NOTE — Progress Notes (Unsigned)
 Psychotherapy Progress Note [facility] Delora Ferry, PhD LP  Patient ID: Haneefah Venturini [facility])    MRN: [id number] Therapy format: Individual psychotherapy Date: 05/16/2024      Start: 3:15p     Stop: 4:05p     Time Spent: 50 min Location: In-person   Session narrative (presenting needs, interim history, self-report of stressors and symptoms, applications of prior therapy, status changes, and interventions made in session) More concerned with Jerryl Morin putting too much on herself.  She got a substantial respiratory infection, been neglecting herself, and laughing off being told she needs to care better for herself.  Found out she was in persistent diarrhea while on an antibiotic and not doing anything about it but using Depends and putting of seeing the doctor, while she began to have some delirious speech.  Eventually took advice to go to ED, and they kept her.  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  Frustrated with how oblivious Jerryl Morin stays in caring for herself and how much she burdens those who love her by getting herself unnecessarily sick.  Despite all she'd been through, including a much needed long sleep, she still got herself driven to [name] nursing home the day after discharge, and in her state, communications errors are ensuing about [name] care.  Meanwhile, Jerryl Morin has confabulated other information.  Agreed all she can do is dispute nicely when dispute is needed  [name] condition is that he's been called as terminal, although considering another foot amputation that might extend his life.  Affirmed and encouraged.Georgette has resolved to go to [facility] for 2 wks starting next week.    Therapeutic modalities: Cognitive Behavioral Therapy, Solution-Oriented/Positive Psychology, and Ego-Supportive  Mental Status/Observations:  Appearance:   Casual     Behavior:  Appropriate  Motor:  Normal  Speech/Language:   Clear and Coherent  Affect:  Appropriate  Mood:  normal   Thought process:  normal  Thought content:    WNL  Sensory/Perceptual disturbances:    WNL  Orientation:  Fully oriented  Attention:  Good    Concentration:  Good  Memory:  WNL  Insight:    Good  Judgment:   Good  Impulse Control:  Good   Risk Assessment: Danger to Self: No Self-injurious Behavior: No Danger to Others: No Physical Aggression / Violence: No Duty to Warn: No Access to Firearms a concern: No  Assessment of progress:  progressing  Diagnosis:   ICD-10-CM   1. Adjustment disorder with mixed anxiety and depressed mood  F43.23     2. 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  Jerryl Morin may still resist, but it establishes best chance of being heard, and possibly acted on.  Other tactics for defeating manipulation and martyring, should Jerryl Morin indulge those. Best for Jerryl Morin to stop alcohol, but clear she won't.  Can recommend B complex to offset some of the damage, and supportively inquire after her self-care, including counseling. Best for [name] status and for [name] ability to manage that they hire in-home assistance or start planning for an assisted living unit Self-affirm full right to decide which burdens to take on herself, both while in [place]  and on nightly calls OK to  limit number, duration, and direction of phone calls OK to adjust and reduce travel plans and to make them more by appointment rather than offering whole, and for long stays While visiting, full authority to enforce privacy At discretion, may call in local Adult Protective Services any time she feels either [name] or [name] welfare are at risk Possible friendly offering to [name] therapist, but preferably work openly with, and through,  [name], newspaper Self-care for stress and depression Maintain health care recommendations, especially re diabetes care Maintain helpful social support at home, including neighbor family, church, others Al-Anon option For political and existential stress, options to get involved with political action as she sees fit, learn about and imagine others working for same values as she, press into her faith Other recommendations/advice -- As may be noted above.  Continue to utilize previously learned skills ad lib. Medication compliance -- Maintain medication as prescribed and work faithfully with relevant prescriber(s) if any changes are desired or seem indicated. Crisis service -- Aware of call list and work-in appts.  Call the clinic on-call service, 988/hotline, 911, or present to [facility] or ER if any life-threatening psychiatric crisis. Followup -- No follow-ups on file.  Next scheduled visit with me 06/12/2024.  Next scheduled in this office 06/12/2024.  Maretta Shaper, PhD Delora Ferry, PhD LP Clinical Psychologist, [facility] [address] [place], [place] [address] [phone]

## 2024-05-22 ENCOUNTER — Other Ambulatory Visit: Payer: Self-pay

## 2024-05-22 DIAGNOSIS — Z8739 Personal history of other diseases of the musculoskeletal system and connective tissue: Secondary | ICD-10-CM | POA: Insufficient documentation

## 2024-05-22 DIAGNOSIS — E119 Type 2 diabetes mellitus without complications: Secondary | ICD-10-CM | POA: Insufficient documentation

## 2024-05-22 DIAGNOSIS — E7800 Pure hypercholesterolemia, unspecified: Secondary | ICD-10-CM | POA: Insufficient documentation

## 2024-05-23 ENCOUNTER — Encounter: Payer: Self-pay | Admitting: Cardiology

## 2024-05-23 ENCOUNTER — Ambulatory Visit: Attending: Cardiology | Admitting: Cardiology

## 2024-05-23 VITALS — BP 108/70 | HR 79 | Ht 61.0 in | Wt 185.4 lb

## 2024-05-23 DIAGNOSIS — I498 Other specified cardiac arrhythmias: Secondary | ICD-10-CM | POA: Insufficient documentation

## 2024-05-23 DIAGNOSIS — I10 Essential (primary) hypertension: Secondary | ICD-10-CM | POA: Diagnosis not present

## 2024-05-23 DIAGNOSIS — Z01812 Encounter for preprocedural laboratory examination: Secondary | ICD-10-CM | POA: Insufficient documentation

## 2024-05-23 DIAGNOSIS — E782 Mixed hyperlipidemia: Secondary | ICD-10-CM | POA: Insufficient documentation

## 2024-05-23 DIAGNOSIS — E1122 Type 2 diabetes mellitus with diabetic chronic kidney disease: Secondary | ICD-10-CM | POA: Insufficient documentation

## 2024-05-23 DIAGNOSIS — N183 Chronic kidney disease, stage 3 unspecified: Secondary | ICD-10-CM | POA: Insufficient documentation

## 2024-05-23 DIAGNOSIS — N189 Chronic kidney disease, unspecified: Secondary | ICD-10-CM | POA: Insufficient documentation

## 2024-05-23 DIAGNOSIS — I421 Obstructive hypertrophic cardiomyopathy: Secondary | ICD-10-CM | POA: Insufficient documentation

## 2024-05-23 DIAGNOSIS — E119 Type 2 diabetes mellitus without complications: Secondary | ICD-10-CM | POA: Insufficient documentation

## 2024-05-23 NOTE — Patient Instructions (Addendum)
 Medication Instructions:  Your physician recommends that you continue on your current medications as directed. Please refer to the Current Medication list given to you today.  *If you need a refill on your cardiac medications before your next appointment, please call your pharmacy*  Lab Work: TODAY (go to lab in urology clinic, [address]): Hemoglobin & Hematocrit If you have labs (blood work) drawn today and your tests are completely normal, you will receive your results only by: MyChart Message (if you have MyChart) OR A paper copy in the mail If you have any lab test that is abnormal or we need to change your treatment, we will call you to review the results.  Testing/Procedures: Your physician has requested that you have a cardiac MRI. Cardiac MRI uses a computer to create images of your heart as its beating, producing both still and moving pictures of your heart and major blood vessels. For further information please visit [email or web address]. Please follow the instruction sheet given to you today for more information.   Follow-Up: At [facility], you and your health needs are our priority.  As part of our continuing mission to provide you with exceptional heart care, our providers are all part of one team.  This team includes your primary Cardiologist (physician) and Advanced Practice Providers or APPs (Physician Assistants and Nurse Practitioners) who all work together to provide you with the care you need, when you need it.  Your next appointment:   9 month(s)  Provider:   Hillis Lu, MD   We recommend signing up for the patient portal called "MyChart".  Sign up information is provided on this After Visit Summary.  MyChart is used to connect with patients for Virtual Visits (Telemedicine).  Patients are able to view lab/test results, encounter notes, upcoming appointments, etc.  Non-urgent messages can be sent to your provider as well.   To learn more about what you can  do with MyChart, go to [email or web address].   Other Instructions   You are scheduled for Cardiac MRI at the location below.  Please arrive for your appointment at ______________ . ?  [facility] [address] [place], [place] [address] Please take advantage of the free valet parking available at the [facility] and [employer] (Entrance C).  Proceed to the [facility] Radiology Department (First Floor) for check-in.   OR   [facility] [address] [place], [place] [address] Please go to the [facility] and check-in with the desk attendant.   Magnetic resonance imaging (MRI) is a painless test that produces images of the inside of the body without using Xrays.  During an MRI, strong magnets and radio waves work together in a Data processing manager to form detailed images.   MRI images may provide more details about a medical condition than X-rays, CT scans, and ultrasounds can provide.  You may be given earphones to listen for instructions.  You may eat a light breakfast and take medications as ordered with the exception of furosemide, hydrochlorothiazide, chlorthalidone or spironolactone (or any other fluid pill). If you are undergoing a stress MRI, please avoid stimulants for 12 hr prior to test. (I.e. Caffeine, nicotine, chocolate, or antihistamine medications)  An IV will be inserted into one of your veins. Contrast material will be injected into your IV. It will leave your body through your urine within a day. You may be told to drink plenty of fluids to help flush the contrast material out of your system.  You will be asked to remove all metal, including: Watch, jewelry, and other metal objects including hearing aids, hair pieces and dentures. Also wearable glucose monitoring systems (ie. Freestyle Libre and Omnipods) (Braces and fillings normally are not a problem.)   TEST WILL TAKE APPROXIMATELY 1 HOUR  PLEASE NOTIFY SCHEDULING AT  LEAST 24 HOURS IN ADVANCE IF YOU ARE UNABLE TO KEEP YOUR APPOINTMENT. [phone]  For more information and frequently asked questions, please visit our website : [email or web address]  Please call the Cardiac Imaging Nurse Navigators with any questions/concerns. [phone] Office

## 2024-05-23 NOTE — Addendum Note (Signed)
 Addended by: Luwana Salvo on: 05/23/2024 02:35 PM   Modules accepted: Orders

## 2024-05-23 NOTE — Progress Notes (Addendum)
 Cardiology Office Note:    Date:  05/23/2024   ID:  Jenny Thompson, DOB 04/03/1946, MRN [id number]  PCP:  Marjean Sierras, DO  Cardiologist:  Nelia Balzarine, MD   Referring MD: Marjean Sierras, DO    ASSESSMENT:    1. Fluttering heart   2. Primary hypertension   3. Diabetes mellitus without complication (HCC)   4. Type 2 diabetes mellitus with stage 3 chronic kidney disease, without long-term current use of insulin, unspecified whether stage 3a or 3b CKD (HCC)   5. Chronic kidney disease, unspecified CKD stage   6. Mixed hyperlipidemia    PLAN:    In order of problems listed above:  Primary prevention stressed with the patient.  Importance of compliance with diet medication stressed and patient verbalized standing. Essential hypertension: Blood pressure is stable and diet was emphasized.  Lifestyle modification urged.   Diabetes mellitus and mixed dyslipidemia: Followed by primary care.  Again diet was emphasized. Hypertrophic cardiomyopathy: Asymptomatic.  I reviewed results with the patient at extensive length.  She is concerned about these findings especially because of the fact that things have changed in the past 2 years or 2 echocardiograms.  I discussed cardiac MRI and she is agreeable.  I would like to get an MRI without contrast as she has renal insufficiency and I discussed this with my nurse to contact radiology to make sure we order the appropriate test without contrast agent. Patient will be seen in follow-up appointment in 6 months or earlier if the patient has any concerns.    Medication Adjustments/Labs and Tests Ordered: Current medicines are reviewed at length with the patient today.  Concerns regarding medicines are outlined above.  Orders Placed This Encounter  Procedures   EKG 12-Lead   No orders of the defined types were placed in this encounter.    No chief complaint on file.    History of Present Illness:    Jenny Thompson is a 78 y.o. female.   Patient has past medical history of essential hypertension, mixed dyslipidemia, diabetes mellitus and renal insufficiency.  She mentions to me that she has been diagnosed with hypertrophic cardiomyopathy and is seeing me for second opinion.  She denies any chest pain orthopnea PND.  She takes care of activities of daily living.  She ambulates age appropriately.  No history of palpitations syncope or any such issues.  At the time of my evaluation, the patient is alert awake oriented and in no distress.  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 Take with a full glass of water on an empty stomach.   amLODipine (NORVASC) 5 MG tablet Take 5 mg by mouth daily.   amoxicillin (AMOXIL) 500 MG capsule Take 500 mg by mouth. 4 caps 1 hour prior to dental appointment.   aspirin EC 81 MG tablet Take 81 mg by mouth daily.   CALCIUM CITRATE-VITAMIN D PO Take 1 tablet by mouth daily. 600 mg/400 iu   Cholecalciferol (VITAMIN D) 50 MCG (2000 UT) CAPS Take 2,000 Units by  mouth daily.   gabapentin (NEURONTIN) 300 MG capsule Take 300 mg by mouth daily.   glipiZIDE (GLUCOTROL XL) 2.5 MG 24 hr tablet Take 2.5 mg by mouth.   metFORMIN (GLUCOPHAGE) 500 MG tablet Take 500 mg by mouth 2 (two) times daily with a meal.   metoprolol tartrate (LOPRESSOR) 25 MG tablet Take 25 mg by mouth.   Multiple Vitamins-Minerals (PRESERVISION AREDS PO) Take by mouth 2 (two) times daily.   OVER THE COUNTER MEDICATION 1 capsule daily at 2 am. Curcumin 300 mg/ 300mg boswellia   rosuvastatin (CRESTOR) 20 MG tablet Take 20 mg by mouth daily.   vitamin B-12 (CYANOCOBALAMIN) 500 MCG tablet Take 500 mcg by mouth daily.   [DISCONTINUED] buPROPion (ZYBAN) 150 MG 12 hr tablet Take 150 mg by mouth 2 (two) times daily.   [DISCONTINUED] clidinium-chlordiazePOXIDE (LIBRAX) 5-2.5 MG capsule Take 1 capsule by mouth as needed.   [DISCONTINUED] hydrochlorothiazide (HYDRODIURIL) 25 MG tablet Take 25 mg by mouth daily.   [DISCONTINUED] Lido-Capsaicin -Men-Methyl Sal (1ST MEDX-PATCH/ LIDOCAINE ) 4-0.374-05-08 % PTCH Apply 1 patch topically daily.   [DISCONTINUED] methocarbamol (ROBAXIN) 750 MG tablet Take 750 mg by mouth as needed for muscle spasms.   [DISCONTINUED] oxyCODONE -acetaminophen  (PERCOCET/ROXICET) 5-325 MG tablet Take 1 tablet by mouth every 6 (six) hours as needed for severe pain.   [DISCONTINUED] ramipril (ALTACE) 10 MG capsule Take 10 mg by mouth daily.     Allergies:   Prednisone   Social History   Socioeconomic History   Marital status: Single    Spouse name: Not on file   Number of children: Not on file   Years of education: Not on file   Highest education level: Not on file  Occupational History   Not on file  Tobacco Use   Smoking status: Never   Smokeless tobacco: Never  Vaping Use   Vaping status: Never Used  Substance and Sexual Activity   Alcohol use: No   Drug use: No   Sexual activity: Not on file  Other Topics Concern   Not on file  Social History Narrative   Not  on file   Social Drivers of Health   Financial Resource Strain: Low Risk  (09/02/2021)   [facility] prior to 02/19/2023., [facility] visits prior to 02/19/2023.   Overall Financial Resource Strain (CARDIA)    Difficulty of Paying Living Expenses: Not hard at all  Food Insecurity: Low Risk  (08/01/2023)   [facility]   Hunger Vital Sign    Worried About Running Out of Food in the Last Year: Never true    Ran Out of Food in the Last Year: Never true  Transportation Needs: No Transportation Needs (08/01/2023)   Received from [employer]    In the past 12 months, has lack of reliable transportation kept you from medical appointments, meetings, work or from getting things needed for  daily living? : No  Physical Activity: Inactive (09/02/2021)   Received from [facility] visits prior to 02/19/2023., [facility] visits prior to 02/19/2023.   Exercise Vital Sign    Days of Exercise per Week: 0 days    Minutes of Exercise per Session: 0 min  Stress: Stress Concern Present (09/02/2021)   [facility] prior to 02/19/2023., [facility] visits prior to 02/19/2023.   [employer] of Occupational Health - Occupational Stress Questionnaire    Feeling of Stress : To some extent  Social Connections: Socially Isolated (09/02/2021)   Received from [facility] visits prior to 02/19/2023., [facility] visits prior to 02/19/2023.   Social Advertising account executive [NHANES]    Frequency of Communication with Friends and Family: More than three times a week    Frequency of Social Gatherings with Friends and Family: Once a week    Attends Religious Services: Never    Database administrator or Organizations: No    Attends Engineer, structural: Never    Marital  Status: Never married     Family History: The patient's family history includes Cancer in her mother; Heart failure in her father; Stroke in her father.  ROS:   Please see the history of present illness.    All other systems reviewed and are negative.  EKGs/Labs/Other Studies Reviewed:    The following studies were reviewed today: .Aaron AasEKG Interpretation Date/Time:  Wednesday May 23 2024 14:20:00 EDT Ventricular Rate:  75 PR Interval:  182 QRS Duration:  112 QT Interval:  414 QTC Calculation: 462 R Axis:   -35  Text Interpretation: Normal sinus rhythm Left axis deviation Moderate voltage criteria for LVH, may be normal variant ( R in aVL , Cornell product ) Inferior infarct , age undetermined Anterolateral infarct , age undetermined No previous ECGs available Confirmed by Hillis Lu [id number]) on 05/23/2024 2:33:59 PM      Recent Labs: No results found for requested labs within last 365 days.  Recent Lipid Panel No results found for: "CHOL", "TRIG", "HDL", "CHOLHDL", "VLDL", "LDLCALC", "LDLDIRECT"  Physical Exam:    VS:  BP 108/70   Pulse 79   Ht 5\' 1"  (1.549 m)   Wt 185 lb 6.4 oz (84.1 kg)   SpO2 98%   BMI 35.03 kg/m     Wt Readings from Last 3 Encounters:  05/23/24 185 lb 6.4 oz (84.1 kg)  06/05/22 185 lb (83.9 kg)  07/02/17 177 lb (80.3 kg)     GEN: Patient is in no acute distress HEENT: Normal NECK: No JVD; No carotid bruits LYMPHATICS: No lymphadenopathy CARDIAC: Hear sounds regular, 2/6 systolic murmur at the apex. RESPIRATORY:  Clear to auscultation without rales, wheezing or rhonchi  ABDOMEN: Soft, non-tender, non-distended MUSCULOSKELETAL:  No edema; No deformity  SKIN: Warm and dry NEUROLOGIC:  Alert and oriented x 3 PSYCHIATRIC:  Normal affect   Signed, Nelia Balzarine, MD  05/23/2024 2:14 PM    [facility]

## 2024-05-24 ENCOUNTER — Ambulatory Visit: Payer: Self-pay | Admitting: Cardiology

## 2024-05-24 DIAGNOSIS — I421 Obstructive hypertrophic cardiomyopathy: Secondary | ICD-10-CM

## 2024-05-24 LAB — HEMOGLOBIN AND HEMATOCRIT, BLOOD
Hematocrit: 43.3 % (ref 34.0–46.6)
Hemoglobin: 14.2 g/dL (ref 11.1–15.9)

## 2024-05-24 LAB — BASIC METABOLIC PANEL WITH GFR
BUN/Creatinine Ratio: 17 (ref 12–28)
BUN: 26 mg/dL (ref 8–27)
CO2: 21 mmol/L (ref 20–29)
Calcium: 10.1 mg/dL (ref 8.7–10.3)
Chloride: 104 mmol/L (ref 96–106)
Creatinine, Ser: 1.56 mg/dL — ABNORMAL HIGH (ref 0.57–1.00)
Glucose: 107 mg/dL — ABNORMAL HIGH (ref 70–99)
Potassium: 5 mmol/L (ref 3.5–5.2)
Sodium: 140 mmol/L (ref 134–144)
eGFR: 34 mL/min/{1.73_m2} — ABNORMAL LOW (ref >59)

## 2024-06-12 ENCOUNTER — Ambulatory Visit (INDEPENDENT_AMBULATORY_CARE_PROVIDER_SITE_OTHER): Admitting: Psychiatry

## 2024-06-12 DIAGNOSIS — F341 Dysthymic disorder: Secondary | ICD-10-CM

## 2024-06-12 DIAGNOSIS — F4323 Adjustment disorder with mixed anxiety and depressed mood: Secondary | ICD-10-CM | POA: Diagnosis not present

## 2024-06-12 DIAGNOSIS — Z636 Dependent relative needing care at home: Secondary | ICD-10-CM

## 2024-06-12 NOTE — Progress Notes (Signed)
 Psychotherapy Progress Note [facility] [name], PhD LP  Patient ID: Jenny Thompson [facility])    MRN: [id number] Therapy format: Individual psychotherapy Date: 06/12/2024      Start: 11:15a     Stop: 12:04p     Time Spent: 49 min Location: In-person   Session narrative (presenting needs, interim history, self-report of stressors and symptoms, applications of prior therapy, status changes, and interventions made in session) Just back from [place] .  Tries to plan travel in ways that allow her to rest and regroup on both ends, but just not working out.  Jenny Thompson tends to drop into ruts, expecting that she either does it all or she can rest and let things be taken care of by [name].  She is drinking substantially less, though her smoking is up.  Dismaying to see her continue to be prisoner of her own attitudes and substances this way, though she is faithfully visiting Jenny Thompson.  Agreed that Jenny Thompson seems to be prisoner of an attitude in which whatever she does is never enough, and it's always 2 wrong answers when she does see a problem, whereas she needs the freedom of seeing multiple right answers to things that are honestly only partly in her power, so she can have permission to just work a task to a reasonable extent and satisfy her needs to achieve, or rest, in their turns, and content herself it's OK.    Took advice while with her about approach to Jenny Thompson, got some things done, agonized less over her.  Jenny Thompson, for his part, seems destined to pass before long, having now sprung multiple mucus membrane bleeders.  Is prepared to go back for funeral and support.  Re. her own health care, new cardiologist is making sense, likes the atmosphere.  Dr. Edwyna interpreted an error in a prior test, and is now recommending MRI for heart, which is challenging for sense of confinement.  Time yet to do the test, will consider whether it warrants anxiety management therapy ahead of time.  Therapeutic  modalities: Cognitive Behavioral Therapy, Solution-Oriented/Positive Psychology, and Ego-Supportive  Mental Status/Observations:  Appearance:   Casual     Behavior:  Appropriate  Motor:  Normal  Speech/Language:   Clear and Coherent  Affect:  Appropriate  Mood:  Poignant at times, appropriate to subject  Thought process:  normal  Thought content:    WNL  Sensory/Perceptual disturbances:    WNL  Orientation:  Fully oriented  Attention:  Good    Concentration:  Good  Memory:  WNL  Insight:    Good  Judgment:   Good  Impulse Control:  Good   Risk Assessment: Danger to Self: No Self-injurious Behavior: No Danger to Others: No Physical Aggression / Violence: No Duty to Warn: No Access to Firearms a concern: No  Assessment of progress:  progressing  Diagnosis:   ICD-10-CM   1. Adjustment disorder with mixed anxiety and depressed mood  F43.23     2. 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  Jenny Thompson may still resist, but it establishes best chance of being heard, and possibly acted on.  Other tactics for defeating manipulation and martyring, should Jenny Thompson indulge those. Best for Jenny Thompson to stop  alcohol, but clear she won't.  Can recommend B complex to offset some of the damage, and supportively inquire after her self-care, including counseling. Best for Jenny Thompson's status and for Jenny Thompson's ability to manage that they hire in-home assistance or start planning for an assisted living unit Self-affirm full right to decide which burdens to take on herself, both while in [place]  and on nightly calls OK to limit number, duration, and direction of phone calls OK to adjust and reduce travel plans and to make them more by appointment  rather than offering whole, and for long stays While visiting, full authority to enforce privacy At discretion, may call in local Adult Protective Services any time she feels either Jenny Thompson's or Jenny Thompson's welfare are at risk Possible friendly offering to [name] therapist, but preferably work openly with, and through, [name], newspaper Self-care for stress and depression Maintain health care recommendations, especially re diabetes care Maintain helpful social support at home, including neighbor family, church, others Al-Anon option For political and existential stress, options to get involved with political action as she sees fit, learn about and imagine others working for same values as she, press into her faith Other recommendations/advice -- As may be noted above.  Continue to utilize previously learned skills ad lib. Medication compliance -- Maintain medication as prescribed and work faithfully with relevant prescriber(s) if any changes are desired or seem indicated. Crisis service -- Aware of call list and work-in appts.  Call the clinic on-call service, 988/hotline, 911, or present to [facility] or ER if any life-threatening psychiatric crisis. Followup -- No follow-ups on file.  Next scheduled visit with me 07/10/2024.  Next scheduled in this office 07/10/2024.  [name], PhD [name], PhD LP Clinical Psychologist, [facility] [address] [place], [place] [address] [phone]

## 2024-07-10 ENCOUNTER — Ambulatory Visit: Admitting: Psychiatry

## 2024-07-16 ENCOUNTER — Telehealth: Payer: Self-pay | Admitting: Cardiology

## 2024-07-16 NOTE — Telephone Encounter (Signed)
 Patient is having a MRI on 8/4, she has a drive now. She would like to have something sedate her for her MRI.

## 2024-07-17 NOTE — Telephone Encounter (Signed)
 Left message for the patient to call back.

## 2024-07-18 ENCOUNTER — Encounter (HOSPITAL_COMMUNITY): Payer: Self-pay

## 2024-07-18 NOTE — Telephone Encounter (Signed)
 Left message for the patient to call back.

## 2024-07-19 ENCOUNTER — Other Ambulatory Visit (HOSPITAL_BASED_OUTPATIENT_CLINIC_OR_DEPARTMENT_OTHER): Payer: Self-pay

## 2024-07-19 ENCOUNTER — Other Ambulatory Visit: Payer: Self-pay

## 2024-07-19 MED ORDER — LORAZEPAM 0.5 MG PO TABS: 0.2500 mg | ORAL_TABLET | Freq: Once | ORAL | 0 refills | Status: AC

## 2024-07-19 NOTE — Telephone Encounter (Signed)
 Called the patient and informed her of Dr. Posey recommendation regarding sedation for her MRI:  We can give her Ativan  0.25 mg to be taken half an before her MRI. She will not be able to drive when she takes this medicine so she will need somebody to write for her because of issues with sedation.  Patient verbalized understanding and had no further questions at this time. The prescription was placed in a secure drawer at the front desk and the patient was notified that it was ready for her to pick up.

## 2024-07-19 NOTE — Telephone Encounter (Signed)
 Patient is requesting to speak with RN Charlie. Please advise.

## 2024-07-19 NOTE — Telephone Encounter (Signed)
 Called the patient and she stated that she was having an MRI on 8/4 and she was requesting something for sedation when she has the MRI. Please advise.

## 2024-07-20 ENCOUNTER — Telehealth (HOSPITAL_COMMUNITY): Payer: Self-pay | Admitting: Emergency Medicine

## 2024-07-20 NOTE — Telephone Encounter (Signed)
 Reaching out to patient to offer assistance regarding upcoming cardiac imaging study; pt verbalizes understanding of appt date/time, parking situation and where to check in, pre-test NPO status and medications ordered, and verified current allergies; name and call back number provided for further questions should they arise Camie Shutter RN Navigator Cardiac Imaging [facility] [phone] office [phone] cell   0.25mg  ativan  phoned into [facility] for patient. Pt aware to take 1 hr prior to scan and is to have a driver Camie

## 2024-07-20 NOTE — Telephone Encounter (Signed)
 Called the patient and she reported that she had gone to the office to pick - up her prescription and it was closed. I explained that she had gone to the [place] office and the prescription for Ativan  sedation was at the [place] office. Patient verbalized that she was upset with [facility]. I explained that she would have to come to the [place] office to pick - up her prescription for sedation for her MRI, because the [facility] office would not be open until 07/24/24. I also explained that I would speak to the administrator to see if there were any other options for her to pick - up her prescription. Spoke to Psychiatrist and she stated that the only option was for the patient to come to the [place] office to pick - up the prescription, or the patient could reach out to her PCP for a prescription for sedation for her MRI. Called the patient and explained the different options for the patient to pick - up her sedation prescription and she verbalized understanding and hung up the phone.

## 2024-07-23 ENCOUNTER — Ambulatory Visit (HOSPITAL_COMMUNITY)
Admission: RE | Admit: 2024-07-23 | Discharge: 2024-07-23 | Disposition: A | Discharge status: Home / Self Care | Source: Ambulatory Visit | Attending: Cardiology | Admitting: Cardiology

## 2024-07-23 ENCOUNTER — Other Ambulatory Visit: Payer: Self-pay | Admitting: Cardiology

## 2024-07-23 DIAGNOSIS — I421 Obstructive hypertrophic cardiomyopathy: Secondary | ICD-10-CM | POA: Insufficient documentation

## 2024-07-23 MED ORDER — GADOBUTROL 1 MMOL/ML IV SOLN
10.0000 mL | Freq: Once | INTRAVENOUS | Status: AC | PRN
  Administered 2024-07-23: 10 mL via INTRAVENOUS

## 2024-07-24 NOTE — Telephone Encounter (Signed)
 Results reviewed with pt as per Dr. Posey note.  Pt verbalized understanding and had no additional questions. Routed to PCP.  Echo not needed as it was done 07/2024 and is in Care Everywhere

## 2024-07-24 NOTE — Telephone Encounter (Signed)
-----   Message from [place] Revankar sent at 07/24/2024  9:44 AM EDT ----- Significantly abnormal cardiac MRI findings.  Kindly get echocardiogram scheduled and the patient and a referral to our hypertrophic cardiomyopathy clinic.  Copy primary Jennifer JONELLE Crape, MD 07/24/2024 9:44 AM  ----- Message ----- From: Interface, Rad Results In Sent: 07/23/2024   6:16 PM EDT To: Jennifer JONELLE Crape, MD

## 2024-07-24 NOTE — Addendum Note (Signed)
 Addended by: ONEITA BERLINER on: 07/24/2024 12:59 PM   Modules accepted: Orders

## 2024-07-31 ENCOUNTER — Encounter: Payer: Self-pay | Admitting: Cardiology

## 2024-08-07 ENCOUNTER — Ambulatory Visit (INDEPENDENT_AMBULATORY_CARE_PROVIDER_SITE_OTHER): Admitting: Psychiatry

## 2024-08-07 ENCOUNTER — Ambulatory Visit: Admitting: Professional Counselor

## 2024-08-07 DIAGNOSIS — F341 Dysthymic disorder: Secondary | ICD-10-CM

## 2024-08-07 DIAGNOSIS — F4323 Adjustment disorder with mixed anxiety and depressed mood: Secondary | ICD-10-CM | POA: Diagnosis not present

## 2024-08-07 DIAGNOSIS — Z636 Dependent relative needing care at home: Secondary | ICD-10-CM

## 2024-08-07 NOTE — Progress Notes (Signed)
 Psychotherapy Progress Note [facility] [name], PhD LP  Patient ID: Jenny Thompson [facility])    MRN: [id number] Therapy format: Individual psychotherapy Date: 08/07/2024      Start: 11:33a     Stop: 12:20p     Time Spent: 47 min Location: In-person   Session narrative (presenting needs, interim history, self-report of stressors and symptoms, applications of prior therapy, status changes, and interventions made in session)  Cancelled last appt, left word (not passed along) that [name] died, in the new nursing home closer to Jenny Thompson home.  Jenny Thompson didn't quite get the framework of palliative care, but an ER doc she's always disliked performed admirably bringing her along to accept how terminal he was, and peace came.  On news of him ailing, Jenny Thompson flew to [place], he passed before she arrived, but good support to her.  And Jenny Thompson seemed to do well with memorial arrangements, even spoke at the service.  Still sees Jenny Thompson moving in slow motion, still, but together.  Condolences offered, validated mix of grief and relief, and affirmed her caring efforts in [place].    On return home, developed ringworm, likely contracted helping Jenny Thompson treat her own.  Had an unusually frustrating time working through a request for sedative medication for her cardiac MRI, which was variously said to be at the office ([place], not the one she goes to in [place]), then at her pharmacy (no, they declined an e-scrip), reviving some of the cynicism about dealing with a medical corporation that led her to seek outside of the system she had been using.  Thankfully, it was worked out Glass blower/designer by a Statistician at the imaging department, and she had the MRI 8/4.  Now, results show substantial cardiomyopathy, and c/o her cardiologist seeming to contradict himself expensively, first pooh-poohing her routine echocardiogram as not that reliable, sending her to MRI, then -- upon getting MRI results -- wanted her to schedule soon for an  echocardiogram.  Has called the question whether the one she already got can be used for clinical decision-making.  Support/validation provided re double messaging and giving feedback to her provider.  Now awaiting scheduling for the update echocardiogram, which wax made to seem fairly urgent.  Last heard from a week ago, but no news yet what to expect.  Facilitated an inquiry during session with cardiologist's nurse, who checked the intended dr's schedule, reported back he is booked out a few weeks, and volunteered to prompt the test schedulers again.  Grateful for the assistance.  Affirmed and encouraged working through further steps pursuing her care.  Therapeutic modalities: Cognitive Behavioral Therapy, Solution-Oriented/Positive Psychology, and Ego-Supportive  Mental Status/Observations:  Appearance:   Casual     Behavior:  Appropriate  Motor:  Normal  Speech/Language:   Clear and Coherent  Affect:  Appropriate  Mood:  dysthymic  Thought process:  normal  Thought content:    WNL  Sensory/Perceptual disturbances:    WNL  Orientation:  Fully oriented  Attention:  Good    Concentration:  Good  Memory:  WNL  Insight:    Good  Judgment:   Good  Impulse Control:  Good   Risk Assessment: Danger to Self: No Self-injurious Behavior: No Danger to Others: No Physical Aggression / Violence: No Duty to Warn: No Access to Firearms a concern: No  Assessment of progress:  progressing  Diagnosis:   ICD-10-CM   1. Adjustment disorder with mixed anxiety and depressed mood  F43.23     2.  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  Jenny Thompson may still resist, but it establishes best chance of being heard, and possibly  acted on.  Other tactics for defeating manipulation and martyring, should Jenny Thompson indulge those. Best for Jenny Thompson to stop alcohol and smoking.  Endorse supporting her in any measures to detoxify her lifestyle, improve nutrition (e.g., B complex, antiinflammatory diet), and maintain and make use of counseling.   Self-affirm full right to decide which burdens to take on herself, both while in [place]  and on nightly calls.  OK to limit number, duration, and direction of phone calls if excessive.  OK to adjust and reduce travel plans and to make them more by appointment rather than offering whole, and for long stays While visiting, full authority to enforce privacy of her own room vs messy boundaries with housekeeper's family At discretion, may call in local Adult Protective Services any time she feels it has escalated to risk of financial abuse. Possible friendly offering of information to [name] therapist, if warranted.  Preferably work openly with, and through, [name], newspaper. Self-care for stress and depression Maintain health care recommendations, especially re diabetes care and cardiac support. Maintain helpful social support at home, including neighbor family, church, others Al-Anon option For political and existential stress, options to get involved with political action as she sees fit, learn about and imagine others working for same values as she, press into her faith Other recommendations/advice -- As may be noted above.  Continue to utilize previously learned skills ad lib. Medication compliance -- Maintain medication as prescribed and work faithfully with relevant prescriber(s) if any changes are desired or seem indicated. Crisis service -- Aware of call list and work-in appts.  Call the clinic on-call service, 988/hotline, 911, or present to [facility] or ER if any life-threatening psychiatric crisis. Followup -- Return for time as already scheduled.  Next scheduled visit with me 09/04/2024.  Next scheduled in this  office 09/04/2024.  [name], PhD [name], PhD LP Clinical Psychologist, [facility] [address], [address] [place], [place] [address] [phone]

## 2024-08-08 ENCOUNTER — Encounter: Payer: Self-pay | Admitting: Internal Medicine

## 2024-09-04 ENCOUNTER — Ambulatory Visit: Admitting: Psychiatry

## 2024-09-04 DIAGNOSIS — Z636 Dependent relative needing care at home: Secondary | ICD-10-CM

## 2024-09-04 DIAGNOSIS — F4323 Adjustment disorder with mixed anxiety and depressed mood: Secondary | ICD-10-CM | POA: Diagnosis not present

## 2024-09-04 NOTE — Progress Notes (Signed)
 Psychotherapy Progress Note [facility] [name], PhD LP  Patient ID: Trisa Cranor [facility])    MRN: [id number] Therapy format: Individual psychotherapy Date: 09/04/2024      Start: 2:06p     Stop: 3:05p     Time Spent: 59 min Location: In-person   Session narrative (presenting needs, interim history, self-report of stressors and symptoms, applications of prior therapy, status changes, and interventions made in session) Repeat deep gratitude for helping her mother Levorn 25 yrs ago.  She was entirely more at ease for opening up in therapy in her twilight years and was much more enjoyable, open, and at peace.  [name] is raising grave concerns with repeated falls at home, on a concrete floor, suspected vestibular problem but also reason to believe she is lowballing how much alcohol she drinks.  (States 1 beer and 2 mixed drinks, but a mixed drink is a tall tumbler full.)  Creating chaos trying to shelter her home from inheritance taxes, [name] got involved with a suspiciously glorified lawyer she knows, pushed a version of a deal to deed the estate to her real Therapist, occupational and friend Burnard, triggering kelly to call Tarnisha to find out what's wrong with her.  Still nightly calls with [name] lasting over 2 hrs, sometimes [name] monologuing most of the way.  Notes [name] has all but stopped asking about her life.  Meanwhile, [name] doctor has threatened, based on her falls, to send her to a nursing home.  From the sound of it, she could definitely use some in home help housekeeping and companionship.  Knows she's smoking and working puzzles 6 hrs a day, drinking as noted, which is more temperate than before but still corrosive.  Sees her failing to understand her bills as well, despite being a retired Airline pilot, and letting some bills go late.  Napping intermittently, conking out on the toilet, burning herself smoking and falling asleep.    Seeing how it's going for [name], and a tortured  inheritance situation with Charlena, also prompted K[place] to get her will and other final arrangements settled recently.  Well-satisfied for doing so, and making additional adjustments to funeral wishes etc as they become apparent.  Discussed interpretation of her situation at length, and diplomatic approach to motivate [name] to make arrangements for when she eventually has problems enough to need help.  Has achieved 6.0 A1C and improved kidney function.  Affirmed and encouraged.  Therapeutic modalities: Cognitive Behavioral Therapy, Solution-Oriented/Positive Psychology, and Ego-Supportive  Mental Status/Observations:  Appearance:   Negative     Behavior:  Appropriate  Motor:  Normal  Speech/Language:   Clear and Coherent  Affect:  Appropriate  Mood:  Concerned, wearied  Thought process:  normal  Thought content:    WNL  Sensory/Perceptual disturbances:    WNL  Orientation:  Fully oriented  Attention:  Good    Concentration:  Good  Memory:  WNL  Insight:    Good  Judgment:   Good  Impulse Control:  Good   Risk Assessment: Danger to Self: No Self-injurious Behavior: No Danger to Others: No Physical Aggression / Violence: No Duty to Warn: No Access to Firearms a concern: No  Assessment of progress:  stabilized  Diagnosis:   ICD-10-CM   1. Adjustment disorder with mixed anxiety and depressed mood  F43.23     2. Caregiver stress  Z63.6      Plan:  Boundaries with [name] care & support May continue to listen supportively, at her discretion,  but when needed, recommend assertiveness procedure (get agreement to listen, make observations, get agreement to listen to a recommendation, then offer).  [name] may still resist, but it establishes best chance of being heard, and possibly acted on.  Other tactics for defeating manipulation and martyring, should [name] indulge those. Best for [name] to stop alcohol and smoking.  Endorse supporting her in any measures to detoxify her lifestyle,  improve nutrition (e.g., B complex, antiinflammatory diet), and maintain and make use of counseling.   Self-affirm full right to decide which burdens to take on herself, both while in [place]  and on nightly calls.  OK to limit number, duration, and direction of phone calls if excessive.  OK to adjust and reduce travel plans and to make them more by appointment rather than offering whole, and for long stays While visiting, full authority to enforce privacy of her own room vs messy boundaries with housekeeper's family At discretion, may call in local Adult Protective Services any time she feels it has escalated to risk of financial abuse. Possible friendly offering of information to [name] therapist, if warranted.  Preferably work openly with, and through, [name], newspaper. Self-care for stress and depression Maintain health care recommendations, especially re diabetes care and cardiac support. Maintain helpful social support at home, including neighbor family, church, others Al-Anon option For political and existential stress, options to get involved with political action as she sees fit, learn about and imagine others working for same values as she, press into her faith Other recommendations/advice -- As may be noted above.  Continue to utilize previously learned skills ad lib. Medication compliance -- Maintain medication as prescribed and work faithfully with relevant prescriber(s) if any changes are desired or seem indicated. Crisis service -- Aware of call list and work-in appts.  Call the clinic on-call service, 988/hotline, 911, or present to [facility] or ER if any life-threatening psychiatric crisis. Followup -- No follow-ups on file.  Next scheduled visit with me 10/01/2024.  Next scheduled in this office 10/01/2024.  [name], PhD [name], PhD LP Clinical Psychologist, [facility] [address] [place], [place] [address] [phone]

## 2024-09-27 ENCOUNTER — Encounter: Payer: Self-pay | Admitting: Internal Medicine

## 2024-10-01 ENCOUNTER — Ambulatory Visit (INDEPENDENT_AMBULATORY_CARE_PROVIDER_SITE_OTHER): Admitting: Psychiatry

## 2024-10-01 DIAGNOSIS — Z636 Dependent relative needing care at home: Secondary | ICD-10-CM

## 2024-10-01 DIAGNOSIS — F341 Dysthymic disorder: Secondary | ICD-10-CM | POA: Diagnosis not present

## 2024-10-01 DIAGNOSIS — F4323 Adjustment disorder with mixed anxiety and depressed mood: Secondary | ICD-10-CM | POA: Diagnosis not present

## 2024-10-01 NOTE — Progress Notes (Signed)
 Psychotherapy Progress Note [facility] [name], PhD LP  Patient ID: Jenny Thompson [facility])    MRN: [id number] Therapy format: Individual psychotherapy Date: 10/01/2024      Start: 2:03p     Stop: 2:53p     Time Spent: 50 min Location: In-person   Session narrative (presenting needs, interim history, self-report of stressors and symptoms, applications of prior therapy, status changes, and interventions made in session) Sleeping less well lately, mind active.  Up all night, slept 6-10 this morning.  Is using orange glasses and no late caffeine, but clear that every evening is still a trip through unwanted information from [name], while [name] is half in the bag on their nightly phone calls.  Really doesn't like hearing the daily drama around [place], her housekeeper, and increasingly finding [name] is not really in listening mode, just monologue mode.  [name] is coming in November, so cleaning up in advance of it.  Support/validation provided.   Probed readiness to address [name] unwanted monologues on [place], which happen pretty much nightly.  Encouraged in being willing to just ask for less of it, then ready to not take the bait if she martyrs or distorts, just want to change the subject.  For a less confrontive approach, can ask [name] what she wants her to get out of the story, as that will change the pattern and perhaps get [name] to think more about her actual intentions and how the habit works.  Tentatively encouraged that she does not drink as much as she was, but still a good 4 or more per evening.  Support/validation provided.   Therapeutic modalities: Cognitive Behavioral Therapy, Solution-Oriented/Positive Psychology, and Ego-Supportive  Mental Status/Observations:  Appearance:   Casual     Behavior:  Appropriate  Motor:  Normal  Speech/Language:   Clear and Coherent  Affect:  Appropriate  Mood:  dysthymic  Thought process:  normal  Thought content:    WNL   Sensory/Perceptual disturbances:    WNL  Orientation:  Fully oriented  Attention:  Good    Concentration:  Good  Memory:  WNL  Insight:    Good  Judgment:   Good  Impulse Control:  Good   Risk Assessment: Danger to Self: No Self-injurious Behavior: No Danger to Others: No Physical Aggression / Violence: No Duty to Warn: No Access to Firearms a concern: No  Assessment of progress:  progressing  Diagnosis:   ICD-10-CM   1. 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 Caregiver stress  Z63.6      Plan:  Boundaries with [name] care & support May continue to listen supportively, at her discretion, but when needed, recommend assertiveness procedure (get agreement to listen, make observations, get agreement to listen to a recommendation, then offer).  [name] may still resist, but it establishes best chance of being heard, and possibly acted on.  Other tactics for defeating manipulation and martyring, should [name] indulge those. Best for [name] to stop alcohol and smoking.  Endorse supporting her in any measures to detoxify her lifestyle, improve nutrition (e.g., B complex, antiinflammatory diet), and maintain and make use of counseling.   Self-affirm full right to decide which burdens to take on herself, both while in [place]  and on nightly calls.  OK to limit number, duration, and direction of phone calls if excessive.  OK to adjust and reduce travel plans and to make them  more by appointment rather than offering whole, and for long stays While visiting, full authority to enforce privacy of her own room vs messy boundaries with housekeeper's family At discretion, may call in local Adult Protective Services any time she feels it has escalated to risk of financial abuse. Possible friendly offering of information to [name] therapist, if warranted.  Preferably work openly with, and through, [name], newspaper. Self-care for stress and  depression Maintain health care recommendations, especially re diabetes care and cardiac support. Maintain helpful social support at home, including neighbor family, church, others Al-Anon option For political and existential stress, options to get involved with political action as she sees fit, learn about and imagine others working for same values as she, press into her faith Other recommendations/advice -- As may be noted above.  Continue to utilize previously learned skills ad lib. Medication compliance -- Maintain medication as prescribed and work faithfully with relevant prescriber(s) if any changes are desired or seem indicated. Crisis service -- Aware of call list and work-in appts.  Call the clinic on-call service, 988/hotline, 911, or present to [facility] or ER if any life-threatening psychiatric crisis. Followup -- No follow-ups on file.  Next scheduled visit with me 11/08/2024.  Next scheduled in this office 11/08/2024.  [name], PhD [name], PhD LP Clinical Psychologist, [facility] [address], [address] [place], [place] [address] [phone]

## 2024-10-04 NOTE — Progress Notes (Incomplete)
 Psychotherapy Progress Note [facility] [name], PhD LP  Patient ID: Jenny Thompson [facility])    MRN: [id number] Therapy format: Individual psychotherapy Date: 10/01/2024      Start: 2:03p     Stop: 2:53p     Time Spent: 50 min Location: In-person   Session narrative (presenting needs, interim history, self-report of stressors and symptoms, applications of prior therapy, status changes, and interventions made in session) Sleeping less well lately, mind active.  Up all night, slept 6-10 this morning.  Is using orange glasses and no late caffeine, but clear that every evening is still a trip through unwanted information from [name], while [name] is half in the bag on their nightly phone calls.  Really doesn't like hearing the daily drama around [place], her housekeeper, and increasingly finding [name] is not really in listening mode, just monologue mode.  [name] is coming in November, so cleaning up in sadvance of it.  Probed readiness to address [name] unwanted monologues on [place].    Therapeutic modalities: {AM:23362::Cognitive Behavioral Therapy,Solution-Oriented/Positive Psychology}  Mental Status/Observations:  Appearance:   {PSY:22683}     Behavior:  {PSY:21022743}  Motor:  {PSY:22302}  Speech/Language:   {PSY:22685}  Affect:  {PSY:22687}  Mood:  {PSY:31886}  Thought process:  {PSY:31888}  Thought content:    {PSY:580 191 3275}  Sensory/Perceptual disturbances:    {[phone]}  Orientation:  {Psych Orientation:23301::Fully oriented}  Attention:  {Good-Fair-Poor ratings:23770::Good}    Concentration:  {Good-Fair-Poor ratings:23770::Good}  Memory:  {PSY:2198296127}  Insight:    {Good-Fair-Poor ratings:23770::Good}  Judgment:   {Good-Fair-Poor ratings:23770::Good}  Impulse Control:  {Good-Fair-Poor ratings:23770::Good}   Risk Assessment: Danger to Self: {Risk:22599::No} Self-injurious Behavior: {Risk:22599::No} Danger to Others:  {Risk:22599::No} Physical Aggression / Violence: {Risk:22599::No} Duty to Warn: {AMYesNo:22526::No} Access to Firearms a concern: {AMYesNo:22526::No}  Assessment of progress:  {Progress:22147::progressing}  Diagnosis:   ICD-10-CM   1. 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 Caregiver stress  Z63.6      Plan:  *** Other recommendations/advice -- As may be noted above.  Continue to utilize previously learned skills ad lib. Medication compliance -- Maintain medication as prescribed and work faithfully with relevant prescriber(s) if any changes are desired or seem indicated. Crisis service -- Aware of call list and work-in appts.  Call the clinic on-call service, 988/hotline, 911, or present to [facility] or ER if any life-threatening psychiatric crisis. Followup -- No follow-ups on file.  Next scheduled visit with me 11/08/2024.  Next scheduled in this office 11/08/2024.  [name], PhD [name], PhD LP Clinical Psychologist, [facility] [address], [address] [place], [place] [address] [phone]

## 2024-10-16 NOTE — Progress Notes (Unsigned)
 Cardiology Office Note:  .    Date:  10/17/2024  ID:  Jenny Thompson, DOB August 25, 1946, MRN [id number] PCP: Jenny Nohemi Shuck, MD  [facility] Providers Cardiologist:  None     CC: [facility] Consulted for the evaluation of asymptomatic oHCM at the behest of Jenny Thompson  History of Present Illness: .    Jenny Thompson is a 78 y.o. female with obstructive hypertrophic cardiomyopathy who presents for evaluation of her cardiac condition and symptoms. She was referred by Jenny Thompson for evaluation of her cardiac condition.  She experiences fatigue and shortness of breath with exertion, particularly during activities like taking out the trash and recycling, but recovers after resting. She denies syncope but occasionally feels lightheaded when standing up after prolonged sitting.  Her cardiac history includes short runs of asymptomatic supraventricular tachycardia noted in March 2025. An echocardiogram from February 2025 showed no resting obstruction with a Valsalva gradient of 18 mmHg and mild mitral regurgitation. A cardiac MRI in August 2025 revealed a maximal septal thickness of 15 mm, mild mitral regurgitation with a regurgitant fraction of 19%, and an abnormally thickened mitral valve with papillary muscle elongation. Her EKG shows signs of left ventricular hypertrophy without secondary repolarization changes.  She is currently on metoprolol tartrate 25 mg twice daily, amlodipine 5 mg daily for blood pressure, rosuvastatin for hyperlipidemia, and aspirin 81 mg daily.  Family history is significant for her father having died from congestive heart failure. Her sister has not been evaluated for hypertrophic cardiomyopathy but has undergone cardiac evaluation prior to surgical procedures, which were reportedly normal.  She has a history of arthritis causing pain in multiple areas, including her back, shoulders, neck, and feet. She also mentions a history of a broken back and a broken arm, which  was not surgically repaired, leading to residual pain and limitations.  Discussed the use of AI scribe software for clinical note transcription with the patient, who gave verbal consent to proceed.  Relevant histories: .  Social  - Father: deceased due to congestive heart failure - No family history of hypertrophic cardiomyopathy or sudden cardiac death. ROS: As per HPI.   Studies Reviewed: .     Cardiac Studies & Procedures   ______________________________________________________________________________________________            CARDIAC MRI  MR CARDIAC MORPHOLOGY W WO CONTRAST 07/23/2024  Narrative CLINICAL DATA:  Hypertrophic cardiomyopathy  EXAM: CARDIAC MRI  TECHNIQUE: The patient was scanned on a 1.5 Tesla GE magnet. A dedicated cardiac coil was used. Functional imaging was done using Fiesta sequences. 2,3, and 4 chamber views were done to assess for RWMA's. Modified Simpson's rule using a short axis stack was used to calculate an ejection fraction on a dedicated work [employer]. The patient received 8 cc of Gadavist . After 10 minutes inversion recovery sequences were used to assess for infiltration and scar tissue.  FINDINGS: Limited images of the lung fields showed no gross abnormalities.  Normal left ventricular size with moderate asymmetric basal septal hypertrophy. 15 mm basal septum, 5 cm basal inferolateral wall. Normal wall motion with LV EF 62%. There was turbulent flow noted in the LV outflow tract. There was mitral valve systolic anterior motion. Mild to moderate mitral regurgitation by regurgitant fraction of 19%. Visually, the mitral regurgitation looked worse (at least moderate). Normal right ventricular size and systolic function, RV EF 52%. Mild left atrial enlargement. Normal right atrium. The aortic valve was not well-visualized, but I am concerned for  a degree of aortic valve stenosis and a possible  functionally bicuspid aortic valve with fused raphe between the left and noncoronary cusps.  On delayed enhancement imaging, there was no myocardial late gadolinium enhancement (LGE).  MEASUREMENTS: MEASUREMENTS LVEDV 103 mL  LVEDVi 54 mL/m2  LVSV 64 mL LVEF 62%  RVEDV 113 mL  RVEDVi 59 mL/m2  RVSV 58 mL RVEF 52%  Aortic forward volume 52 mL  Aortic regurgitant fraction 0%  Global T1 1038, ECV 26%  Global T2 49 (within normal limits)  IMPRESSION: 1. Normal LV size with moderate asymmetric basal septal hypertrophy. Normal wall motion, LV EF 62%. Turbulent flow in LV outflow tract.  2.  Normal RV size and systolic function, RV EF 52%.  3. There was mitral valve systolic anterior motion with mild-moderate mitral regurgitation by regurgitant fraction though MR looked at least moderate visually.  4. The aortic valve was not well-visualized but looked functionally bicuspid and restricted.  5.  Normal extracellular volume percentage.  6.  No myocardial LGE was noted on this study.  This study is suggestive of hypertrophic obstructive cardiomyopathy but with no delayed enhancement. Would review echo to assess LV outflow tract gradient and also for interrogation of aortic valve which appears to have a degree of stenosis that would be better assessed by echo. Patient may benefit from cardiac myosin inhibitor and referral to HCM clinic.  Jenny Thompson   Electronically Signed By: Jenny Thompson M.D. On: 07/23/2024 18:13   ______________________________________________________________________________________________      LABS Creatinine: 1.56  RADIOLOGY Cardiac MRI: Maximal septal thickness 15 mm, mild mitral regurgitation with regurgitant fraction 19%, moderate mitral regurgitation visually, valve morphology not well evaluated, no short axis legs (07/23/2024) Cardiac MRI: LVOT flow acceleration, thickened mitral valve, papillary muscle elongation, thickened  anterior leaflet Aortic Valve Imaging: Thickened aortic valve, likely trileaflet  DIAGNOSTIC Supraventricular Tachycardia: Short runs without atrial fibrillation or atrial flutter (02/28/2024) Echocardiogram: No resting obstruction, Valsalva gradient 18 mmHg, systolic anterior motion of mitral valve (02/02/2024) EKG: Left ventricular hypertrophy without secondary repolarization  Physical Exam:    VS:  BP (!) 140/86 (Cuff Size: Large)   Pulse 74   Ht 5' 1 (1.549 m)   Wt 186 lb 3.2 oz (84.5 kg)   SpO2 97%   BMI 35.18 kg/m    Wt Readings from Last 3 Encounters:  10/17/24 186 lb 3.2 oz (84.5 kg)  05/23/24 185 lb 6.4 oz (84.1 kg)  06/05/22 185 lb (83.9 kg)    Gen: no distress   Neck: No JVD Cardiac: No Rubs or Gallops, harsh systolic murmur, RRR +2 radial pulses Respiratory: Clear to auscultation bilaterally, normal effort, normal  respiratory rate GI: Soft, nontender, non-distended  MS: No  edema;  moves all extremities Integument: Skin feels warm Neuro:  At time of evaluation, alert and oriented to person/place/time/situation  Psych: Normal affect, patient feels ok  ASSESSMENT AND PLAN: .    Hypertrophic Cardiomyopathy  - Septal Variant - peak gradient 18 mm Hg on  Valsalva on metoprolol monotherapy  - with primary MR and unclear AV morphology  - suspicion of Fabry's/Danon/Noonan's or other mimics of HCM: low - Gene variant: Deferred - NYHA II  - Non HCM Contributors to disease/status Hypertension Blood pressure is well controlled on current medication regimen. Recent reading was elevated but improved during the visit. - [place] HTN, given concomitant LVOT concerns I have increased her AV nodal therapy  Type 2 diabetes mellitus with stage 3 chronic kidney disease -  Diabetes is well controlled with an A1c of 6.0. CKD stage 3 with creatinine of 1.56. - discussed diet and grace with strict dieting  Hyperlipidemia - Managed with rosuvastatin. Cholesterol levels are  historically well controlled  Family history , Discussed family screening via echo given   SCD  Assessment - CMR from 2025 notable for no scar - 2 year assessment for VT on rhythm monitor showed no VT or NSVT - SCD risk estimated to be 1.07 at 5 years SDM: we have discussed Class III indication for ICD; will not pursue ICD at this time  Atrial fibrillation Assessment - HCM-AF score 23 - Atrial arrhythmia management: Asymptomatic SVT on 2025 heart monitor, may re-assess in 2026 or 2027 based on sx burden  Medication symptom plan - Mild symptoms of fatigue and exertional dyspnea. Previous echocardiograms showed no resting obstruction, but dynamic obstruction is suspected. MRI showed maximal septal thickness of 15 mm and mild mitral regurgitation. Aortic valve appears trileaflet but thickened. Low risk of sudden cardiac death (1% over the last five years) due to age and lack of family history. Increased risk of atrial fibrillation. No indication for defibrillator at this time. - Ordered stress echocardiogram to assess for dynamic obstruction. - Increased metoprolol  to 100 mg daily using the long-acting formulation (metoprolol succinate). - Instructed to monitor for hypotension or worsening symptoms with increased metoprolol dose. - Deferred genetic testing for hypertrophic cardiomyopathy - Will consider coronary artery calcium score based on stress echocardiogram results (AV calcium score)   F/u with me in Late February unless sx from [facility]  Time Spent Directly with Patient:   I have spent a total of 50 minutes with the patient reviewing notes, imaging, EKGs, labs, and examining the patient as well as establishing an assessment and plan that was discussed personally with the patient. Discussed disease state education  and cardiac modeling.   Stanly Leavens, MD FASE [facility] Cardiologist [facility]  [address] [place], [address] [place], [place] [address] [phone]  9:12  AM

## 2024-10-17 ENCOUNTER — Ambulatory Visit: Attending: Cardiology | Admitting: Internal Medicine

## 2024-10-17 ENCOUNTER — Encounter: Payer: Self-pay | Admitting: Internal Medicine

## 2024-10-17 VITALS — BP 140/86 | HR 74 | Ht 61.0 in | Wt 186.2 lb

## 2024-10-17 DIAGNOSIS — I10 Essential (primary) hypertension: Secondary | ICD-10-CM | POA: Insufficient documentation

## 2024-10-17 DIAGNOSIS — I421 Obstructive hypertrophic cardiomyopathy: Secondary | ICD-10-CM | POA: Diagnosis not present

## 2024-10-17 DIAGNOSIS — E782 Mixed hyperlipidemia: Secondary | ICD-10-CM | POA: Diagnosis not present

## 2024-10-17 MED ORDER — METOPROLOL SUCCINATE ER 100 MG PO TB24: 100.0000 mg | ORAL_TABLET | Freq: Every day | ORAL | 3 refills | Status: AC

## 2024-10-17 NOTE — Patient Instructions (Addendum)
 Medication Instructions:  Please STOP metoprolol tartrate.  Please START taking metoprolol succinate 100 mg daily.   *If you need a refill on your cardiac medications before your next appointment, please call your pharmacy*  Lab Work: None.  If you have labs (blood work) drawn today and your tests are completely normal, you will receive your results only by: MyChart Message (if you have MyChart) OR A paper copy in the mail If you have any lab test that is abnormal or we need to change your treatment, we will call you to review the results.  Testing/Procedures:  Stress Echocardiogram Information Sheet                                                      Instructions:    1. You may take your morning medications the morning of the test  2. Light breakfast no caffeine  3. Dress prepared to exercise.  4. DO NOT use ANY caffeine or tobacco products 3 hours before appointment.  5. Please bring all current prescription medications.  Follow-Up: At [facility], you and your health needs are our priority.  As part of our continuing mission to provide you with exceptional heart care, our providers are all part of one team.  This team includes your primary Cardiologist (physician) and Advanced Practice Providers or APPs (Physician Assistants and Nurse Practitioners) who all work together to provide you with the care you need, when you need it.  Your next appointment:   3 month(s) (February 2026)  Provider:   Dr. Stanly Leavens   We recommend signing up for the patient portal called MyChart.  Sign up information is provided on this After Visit Summary.  MyChart is used to connect with patients for Virtual Visits (Telemedicine).  Patients are able to view lab/test results, encounter notes, upcoming appointments, etc.  Non-urgent messages can be sent to your provider as well.   To learn more about what you can do with MyChart, go to [email or web address].   Other  Instructions Dr. Leavens may order a coronary calcium CT score pending the results of your stress echocardiogram.         Hypertension, Adult High blood pressure (hypertension) is when the force of blood pumping through the arteries is too strong. The arteries are the blood vessels that carry blood from the heart throughout the body. Hypertension forces the heart to work harder to pump blood and may cause arteries to become narrow or stiff. Untreated or uncontrolled hypertension can lead to a heart attack, heart failure, a stroke, kidney disease, and other problems. A blood pressure reading consists of a higher number over a lower number. Ideally, your blood pressure should be below 120/80. The first (top) number is called the systolic pressure. It is a measure of the pressure in your arteries as your heart beats. The second (bottom) number is called the diastolic pressure. It is a measure of the pressure in your arteries as the heart relaxes. What are the causes? The exact cause of this condition is not known. There are some conditions that result in high blood pressure. What increases the risk? Certain factors may make you more likely to develop high blood pressure. Some of these risk factors are under your control, including: Smoking. Not getting enough exercise or physical activity. Being overweight. Having too much  fat, sugar, calories, or salt (sodium) in your diet. Drinking too much alcohol. Other risk factors include: Having a personal history of heart disease, diabetes, high cholesterol, or kidney disease. Stress. Having a family history of high blood pressure and high cholesterol. Having obstructive sleep apnea. Age. The risk increases with age. What are the signs or symptoms? High blood pressure may not cause symptoms. Very high blood pressure (hypertensive crisis) may cause: Headache. Fast or irregular heartbeats (palpitations). Shortness of  breath. Nosebleed. Nausea and vomiting. Vision changes. Severe chest pain, dizziness, and seizures. How is this diagnosed? This condition is diagnosed by measuring your blood pressure while you are seated, with your arm resting on a flat surface, your legs uncrossed, and your feet flat on the floor. The cuff of the blood pressure monitor will be placed directly against the skin of your upper arm at the level of your heart. Blood pressure should be measured at least twice using the same arm. Certain conditions can cause a difference in blood pressure between your right and left arms. If you have a high blood pressure reading during one visit or you have normal blood pressure with other risk factors, you may be asked to: Return on a different day to have your blood pressure checked again. Monitor your blood pressure at home for 1 week or longer. If you are diagnosed with hypertension, you may have other blood or imaging tests to help your health care provider understand your overall risk for other conditions. How is this treated? This condition is treated by making healthy lifestyle changes, such as eating healthy foods, exercising more, and reducing your alcohol intake. You may be referred for counseling on a healthy diet and physical activity. Your health care provider may prescribe medicine if lifestyle changes are not enough to get your blood pressure under control and if: Your systolic blood pressure is above 130. Your diastolic blood pressure is above 80. Your personal target blood pressure may vary depending on your medical conditions, your age, and other factors. Follow these instructions at home: Eating and drinking  Eat a diet that is high in fiber and potassium, and low in sodium, added sugar, and fat. An example of this eating plan is called the DASH diet. DASH stands for Dietary Approaches to Stop Hypertension. To eat this way: Eat plenty of fresh fruits and vegetables. Try to fill  one half of your plate at each meal with fruits and vegetables. Eat whole grains, such as whole-wheat pasta, brown rice, or whole-grain bread. Fill about one fourth of your plate with whole grains. Eat or drink low-fat dairy products, such as skim milk or low-fat yogurt. Avoid fatty cuts of meat, processed or cured meats, and poultry with skin. Fill about one fourth of your plate with lean proteins, such as fish, chicken without skin, beans, eggs, or tofu. Avoid pre-made and processed foods. These tend to be higher in sodium, added sugar, and fat. Reduce your daily sodium intake. Many people with hypertension should eat less than 1,500 mg of sodium a day. Do not drink alcohol if: Your health care provider tells you not to drink. You are pregnant, may be pregnant, or are planning to become pregnant. If you drink alcohol: Limit how much you have to: 0-1 drink a day for women. 0-2 drinks a day for men. Know how much alcohol is in your drink. In the [place] one drink equals one 12 oz bottle of beer (355 mL), one 5 oz glass of wine (  148 mL), or one 1 oz glass of hard liquor (44 mL). Lifestyle  Work with your health care provider to maintain a healthy body weight or to lose weight. Ask what an ideal weight is for you. Get at least 30 minutes of exercise that causes your heart to beat faster (aerobic exercise) most days of the week. Activities may include walking, swimming, or biking. Include exercise to strengthen your muscles (resistance exercise), such as Pilates or lifting weights, as part of your weekly exercise routine. Try to do these types of exercises for 30 minutes at least 3 days a week. Do not use any products that contain nicotine or tobacco. These products include cigarettes, chewing tobacco, and vaping devices, such as e-cigarettes. If you need help quitting, ask your health care provider. Monitor your blood pressure at home as told by your health care provider. Keep all follow-up visits.  This is important. Medicines Take over-the-counter and prescription medicines only as told by your health care provider. Follow directions carefully. Blood pressure medicines must be taken as prescribed. Do not skip doses of blood pressure medicine. Doing this puts you at risk for problems and can make the medicine less effective. Ask your health care provider about side effects or reactions to medicines that you should watch for. Contact a health care provider if you: Think you are having a reaction to a medicine you are taking. Have headaches that keep coming back (recurring). Feel dizzy. Have swelling in your ankles. Have trouble with your vision. Get help right away if you: Develop a severe headache or confusion. Have unusual weakness or numbness. Feel faint. Have severe pain in your chest or abdomen. Vomit repeatedly. Have trouble breathing. These symptoms may be an emergency. Get help right away. Call 911. Do not wait to see if the symptoms will go away. Do not drive yourself to the hospital. Summary Hypertension is when the force of blood pumping through your arteries is too strong. If this condition is not controlled, it may put you at risk for serious complications. Your personal target blood pressure may vary depending on your medical conditions, your age, and other factors. For most people, a normal blood pressure is less than 120/80. Hypertension is treated with lifestyle changes, medicines, or a combination of both. Lifestyle changes include losing weight, eating a healthy, low-sodium diet, exercising more, and limiting alcohol. This information is not intended to replace advice given to you by your health care provider. Make sure you discuss any questions you have with your health care provider. Document Revised: 10/13/2021 Document Reviewed: 10/13/2021 Elsevier Patient Education  2024 Elsevier Inc.    Why follow it? Research shows. Those who follow the Mediterranean diet  have a reduced risk of heart disease  The diet is associated with a reduced incidence of Parkinson's and Alzheimer's diseases People following the diet may have longer life expectancies and lower rates of chronic diseases  The Dietary Guidelines for Americans recommends the Mediterranean diet as an eating plan to promote health and prevent disease  What Is the Mediterranean Diet?  Healthy eating plan based on typical foods and recipes of Mediterranean-style cooking The diet is primarily a plant based diet; these foods should make up a majority of meals   Starches - Plant based foods should make up a majority of meals - They are an important sources of vitamins, minerals, energy, antioxidants, and fiber - Choose whole grains, foods high in fiber and minimally processed items  - Typical grain sources include  wheat, oats, barley, corn, brown rice, bulgar, farro, millet, polenta, couscous  - Various types of beans include chickpeas, lentils, fava beans, black beans, white beans   Fruits  Veggies - Large quantities of antioxidant rich fruits & veggies; 6 or more servings  - Vegetables can be eaten raw or lightly drizzled with oil and cooked  - Vegetables common to the traditional Mediterranean Diet include: artichokes, arugula, beets, broccoli, brussel sprouts, cabbage, carrots, celery, collard greens, cucumbers, eggplant, kale, leeks, lemons, lettuce, mushrooms, okra, onions, peas, peppers, potatoes, pumpkin, radishes, rutabaga, shallots, spinach, sweet potatoes, turnips, zucchini - Fruits common to the Mediterranean Diet include: apples, apricots, avocados, cherries, clementines, dates, figs, grapefruits, grapes, melons, nectarines, oranges, peaches, pears, pomegranates, strawberries, tangerines  Fats - Replace butter and margarine with healthy oils, such as olive oil, canola oil, and tahini  - Limit nuts to no more than a handful a day  - Nuts include walnuts, almonds, pecans, pistachios, pine nuts   - Limit or avoid candied, honey roasted or heavily salted nuts - Olives are central to the [employer] - can be eaten whole or used in a variety of dishes   Meats Protein - Limiting red meat: no more than a few times a month - When eating red meat: choose lean cuts and keep the portion to the size of deck of cards - Eggs: approx. 0 to 4 times a week  - Fish and lean poultry: at least 2 a week  - Healthy protein sources include, chicken, turkey, lean beef, lamb - Increase intake of seafood such as tuna, salmon, trout, mackerel, shrimp, scallops - Avoid or limit high fat processed meats such as sausage and bacon  Dairy - Include moderate amounts of low fat dairy products  - Focus on healthy dairy such as fat free yogurt, skim milk, low or reduced fat cheese - Limit dairy products higher in fat such as whole or 2% milk, cheese, ice cream  Alcohol - Moderate amounts of red wine is ok  - No more than 5 oz daily for women (all ages) and men older than age 68  - No more than 10 oz of wine daily for men younger than 7  Other - Limit sweets and other desserts  - Use herbs and spices instead of salt to flavor foods  - Herbs and spices common to the traditional Mediterranean Diet include: basil, bay leaves, chives, cloves, cumin, fennel, garlic, lavender, marjoram, mint, oregano, parsley, pepper, rosemary, sage, savory, sumac, tarragon, thyme   It's not just a diet, it's a lifestyle:  The Mediterranean diet includes lifestyle factors typical of those in the region  Foods, drinks and meals are best eaten with others and savored Daily physical activity is important for overall good health This could be strenuous exercise like running and aerobics This could also be more leisurely activities such as walking, housework, yard-work, or taking the stairs Moderation is the key; a balanced and healthy diet accommodates most foods and drinks Consider portion sizes and frequency of consumption of  certain foods   Meal Ideas & Options:  Breakfast:  Whole wheat toast or whole wheat English muffins with peanut butter & hard boiled egg Steel cut oats topped with apples & cinnamon and skim milk  Fresh fruit: banana, strawberries, melon, berries, peaches  Smoothies: strawberries, bananas, greek yogurt, peanut butter Low fat greek yogurt with blueberries and granola  Egg white omelet with spinach and mushrooms Breakfast couscous: whole wheat couscous, apricots, skim milk, cranberries  Sandwiches:  Hummus and grilled vegetables (peppers, zucchini, squash) on whole wheat bread   Grilled chicken on whole wheat pita with lettuce, tomatoes, cucumbers or tzatziki  Tuna salad on whole wheat bread: tuna salad made with greek yogurt, olives, red peppers, capers, green onions Garlic rosemary lamb pita: lamb sauted with garlic, rosemary, salt & pepper; add lettuce, cucumber, greek yogurt to pita - flavor with lemon juice and black pepper  Seafood:  Mediterranean grilled salmon, seasoned with garlic, basil, parsley, lemon juice and black pepper Shrimp, lemon, and spinach whole-grain pasta salad made with low fat greek yogurt  Seared scallops with lemon orzo  Seared tuna steaks seasoned salt, pepper, coriander topped with tomato mixture of olives, tomatoes, olive oil, minced garlic, parsley, green onions and cappers  Meats:  Herbed greek chicken salad with kalamata olives, cucumber, feta  Red bell peppers stuffed with spinach, bulgur, lean ground beef (or lentils) & topped with feta   Kebabs: skewers of chicken, tomatoes, onions, zucchini, squash  [place] burgers: made with red onions, mint, dill, lemon juice, feta cheese topped with roasted red peppers Vegetarian Cucumber salad: cucumbers, artichoke hearts, celery, red onion, feta cheese, tossed in olive oil & lemon juice  Hummus and whole grain pita points with a greek salad (lettuce, tomato, feta, olives, cucumbers, red onion) Lentil soup with  celery, carrots made with vegetable broth, garlic, salt and pepper  Tabouli salad: parsley, bulgur, mint, scallions, cucumbers, tomato, radishes, lemon juice, olive oil, salt and pepper.

## 2024-10-18 ENCOUNTER — Encounter: Payer: Self-pay | Admitting: Internal Medicine

## 2024-10-22 ENCOUNTER — Ambulatory Visit (HOSPITAL_COMMUNITY)

## 2024-11-08 ENCOUNTER — Ambulatory Visit: Admitting: Psychiatry

## 2024-11-08 DIAGNOSIS — Z638 Other specified problems related to primary support group: Secondary | ICD-10-CM | POA: Diagnosis not present

## 2024-11-08 DIAGNOSIS — F341 Dysthymic disorder: Secondary | ICD-10-CM

## 2024-11-08 DIAGNOSIS — F4323 Adjustment disorder with mixed anxiety and depressed mood: Secondary | ICD-10-CM | POA: Diagnosis not present

## 2024-11-08 NOTE — Progress Notes (Signed)
 Psychotherapy Progress Note [facility] [name], PhD LP  Patient ID: Jenny Thompson [facility])    MRN: [id number] Therapy format: Individual psychotherapy Date: 11/08/2024      Start: 2:008p     Stop: 2:58p     Time Spent: 50 min Location: In-person   Session narrative (presenting needs, interim history, self-report of stressors and symptoms, applications of prior therapy, status changes, and interventions made in session) [name] will be visiting soon.  One quandary whether to bring her to next session.  Advised it is OK if Joycelin wants to, it is her right to accept or decline, but if [name] does come, it is still at her discretion whether to broach any topic of difficulty between them.  Another issue -- chronic now -- how [name] calls every night and consumes time with both pitying and complaining on [name], her immigrant (and now citizen) housekeeper.  Thoroughly tired of hearing about her but does not reiterate it, and [name] may offer that she knows she doesn't want to hear much about her but then go into her monologue anyway.  Reviewed options and terms of self-respect, allowing that if she truly chooses to allow the subject as a way of caring for [name] (or recognizing inebriation), that, too is her prerogative.  New cardiologist is working out very well, at the [facility] on [place].  Excellent bedside manner, and impressive for having read the chart and for explaining testing and medication decisions aptly.  She does need a new ultrasound, but it is with contrast, which prior cardio did not explain.  Affirmed making her choices to reach a good specialist.  Therapeutic modalities: Cognitive Behavioral Therapy, Solution-Oriented/Positive Psychology, and Ego-Supportive  Mental Status/Observations:  Appearance:   Casual     Behavior:  Appropriate  Motor:  Normal  Speech/Language:   Clear and Coherent  Affect:  Appropriate  Mood:  dysthymic  Thought process:  normal   Thought content:    WNL  Sensory/Perceptual disturbances:    WNL  Orientation:  Fully oriented  Attention:  Good    Concentration:  Good  Memory:  WNL  Insight:    Good  Judgment:   Good  Impulse Control:  Good   Risk Assessment: Danger to Self: No Self-injurious Behavior: No Danger to Others: No Physical Aggression / Violence: No Duty to Warn: No Access to Firearms a concern: No  Assessment of progress:  progressing  Diagnosis:   ICD-10-CM   1. 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 Relationship problem with family member  Z63.8      Plan:  Boundaries with [name] care & support May continue to listen supportively, at her discretion, but when needed, recommend assertiveness procedure (get agreement to listen, make observations, get agreement to listen to a recommendation, then offer).  [name] may still resist, but it establishes best chance of being heard, and possibly acted on.  Other tactics for defeating manipulation and martyring, should [name] indulge those. Best for [name] to stop alcohol and smoking.  Endorse supporting her in any measures to detoxify her lifestyle, improve nutrition (e.g., B complex, antiinflammatory diet), and maintain and make use of counseling.   Self-affirm full right to decide which burdens to take on herself, both while in [place]  and on nightly calls.  OK to limit number, duration, and direction of phone calls if excessive.  OK to adjust  and reduce travel plans and to make them more by appointment rather than offering whole, and for long stays While visiting, full authority to enforce privacy of her own room vs messy boundaries with housekeeper's family At discretion, may call in local Adult Protective Services any time she feels it has escalated to risk of financial abuse. Possible friendly offering of information to [name] therapist, if warranted.  Preferably work openly with, and  through, [name], [name]. Self-care for stress and depression Maintain health care recommendations, especially re diabetes care and cardiac support. Maintain helpful social support at home, including neighbor family, church, others [employer] option For political and existential stress, options to get involved with political action as she sees fit, learn about and imagine others working for same values as she, press into her faith Other recommendations/advice -- As may be noted above.  Continue to utilize previously learned skills ad lib. Medication compliance -- Maintain medication as prescribed and work faithfully with relevant prescriber(s) if any changes are desired or seem indicated. Crisis service -- Aware of call list and work-in appts.  Call the clinic on-call service, 988/hotline, 911, or present to [facility] or ER if any life-threatening psychiatric crisis. Followup -- Return for time as already scheduled.  Next scheduled visit with me 11/20/2024.  Next scheduled in this office 11/20/2024.  [name], PhD [name], PhD LP Clinical Psychologist, [facility] [address], [address] [place], [place] [address] [phone]

## 2024-11-14 ENCOUNTER — Telehealth (HOSPITAL_COMMUNITY): Payer: Self-pay | Admitting: *Deleted

## 2024-11-14 NOTE — Telephone Encounter (Signed)
 Reminder call given for upcoming stress test on 11/23/24 at 12:45

## 2024-11-20 ENCOUNTER — Encounter: Payer: Self-pay | Admitting: Internal Medicine

## 2024-11-20 ENCOUNTER — Ambulatory Visit: Admitting: Psychiatry

## 2024-11-20 DIAGNOSIS — Z638 Other specified problems related to primary support group: Secondary | ICD-10-CM

## 2024-11-20 DIAGNOSIS — F341 Dysthymic disorder: Secondary | ICD-10-CM

## 2024-11-20 DIAGNOSIS — F4323 Adjustment disorder with mixed anxiety and depressed mood: Secondary | ICD-10-CM | POA: Diagnosis not present
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
## 2024-11-23 ENCOUNTER — Ambulatory Visit (HOSPITAL_COMMUNITY): Admission: RE | Admit: 2024-11-23 | Source: Ambulatory Visit

## 2024-11-23 ENCOUNTER — Ambulatory Visit (HOSPITAL_COMMUNITY)

## 2024-11-27 NOTE — Progress Notes (Signed)
 Assessment:  Encounter Diagnoses  Name Primary?   Nephrolithiasis Yes   History of gross hematuria       Plan: 1.  Plan to address right renal stone burden with cystoscopy, right retrograde pyelogram, right ureteroscopy, laser lithotripsy, stone ablation, right ureteral stent placement 2.  Urine for culture   Referring Physician:  No referring provider defined for this encounter.  PCP:  Mintu Ronal Rush, MD  CC: Nephrolithiasis  HPI:  78 year old female with history of nephrolithiasis presents for follow-up.  History of nephrolithiasis.  Last intervention with lithotripsy in 10/23.  Stone is 76% calcium oxalate monohydrate/4% calcium phosphate/20% organic material.  KUB today 3 possible stones at right UPJ.  CT stone in 11/25 with three 6 mm right UPJ stones and two 5 mm left renal stones.    History of intermittent gross painless hematuria.  No smoking history or chemical/dye exposures.  No recurrent UTIs.  CT in 7/23 significant only for right renal stones x2 that have since been treated.  No gross hematuria since stones treated.   LAB RESULTS REVIEWED: Most recent serum creatinine level: Lab Results  Component Value Date/Time   CREATININE 1.35 (H) 09/18/2024 10:15 AM    PSA levels: No results found for: PSA  Serum testosterone levels: No results found for: TESTOSTERONE   PMH:  Past Medical History:  Diagnosis Date   Diabetes mellitus (CMD)    Diabetes mellitus type II, controlled (CMD)    Glaucoma    Glaucoma suspect of both eyes    High cholesterol    Hypertension    Venous engorgement of both eyes      PSH: Past Surgical History:  Procedure Laterality Date   BREAST EXCISIONAL BIOPSY Right    Procedure: BREAST EXCISIONAL BIOPSY; benign 1980s   CATARACT EXTRACTION W/  INTRAOCULAR LENS IMPLANT Right 03/23/2006   Procedure: CATARACT EXTRACTION W/  INTRAOCULAR LENS IMPLANT   CATARACT EXTRACTION W/  INTRAOCULAR LENS IMPLANT Left  05/28/2010   Procedure: CATARACT EXTRACTION W/  INTRAOCULAR LENS IMPLANT   EXTRACORPOREAL SHOCK WAVE LITHOTRIPSY Right 09/30/2022   Procedure: ESWL;  Surgeon: Chauncey Redell Agent, MD;  Location: [facility];  Service: Urology;  Laterality: Right;   HIP SURGERY     Procedure: HIP SURGERY   OTHER SURGICAL HISTORY Left    Procedure: OTHER SURGICAL HISTORY (arm surgery)   TOTAL HIP ARTHROPLASTY Right    Procedure: TOTAL HIP REPLACEMENT       Medications: Current Outpatient Medications  Medication Sig Dispense Refill   amLODIPine (NORVASC) 5 mg tablet TAKE 1 TABLET BY MOUTH EVERY DAY FOR BLOOD PRESSURE 90 tablet 0   amoxicillin (AMOXIL) 500 mg capsule  (Patient taking differently: As needed for dental work)     aspirin 81 mg EC tablet Take 81 mg by mouth.     cholecalciferol (VITAMIN D3) 1,000 unit (25 mcg) tablet TAKE 1 TABLET BY MOUTH EVERY DAY     cyanocobalamin (VITAMIN B12) 500 mcg tablet TAKE 1 TABLET BY MOUTH DAILY 90 tablet 1   gabapentin (NEURONTIN) 300 mg capsule TAKE 2 CAPSULES BY MOUTH DAILY 180 capsule 0   glipiZIDE (GLUCOTROL XL) 2.5 mg 24 hr tablet TAKE 1 TABLET BY MOUTH EVERY DAY WITH BREAKFAST 90 tablet 3   glucose blood (OneTouch Ultra Test) test strip USE AS DIRECTED TO TEST BLOOD GLUCOSE 1 TO 2 TIMES DAILY (DX E11.22) 100 each 1   glucose blood (Precision Q-I-D Test) test strip USE TO CHECK BLOOD SUGAR ONCE DAILY (E11.22) 200 each  3   hydrocortisone (ANUSOL-HC) 2.5 % rectal cream APPLY TO AFFECTED AREAS 4 TIMES DAILY FOR ITCHY SKIN 30 g 5   ketoconazole (NIZORAL) 2 % cream Apply topically daily. Apply to the rash twice a day.  Return for any worsening. 30 g 1   metFORMIN (GLUCOPHAGE) 500 mg tablet TAKE 1 TABLET BY MOUTH EVERY MORNING WITH BREAKFAST & TAKE 1 TABLET WITH DINNER AS DIRECTED 180 tablet 3   metoprolol  tartrate (LOPRESSOR ) 25 mg tablet Take 1 tablet (25 mg total) by mouth 2 (two) times a day. 180 tablet 3   mupirocin (BACTROBAN) 2 % ointment  Apply to the bilateral nasal cavities every morning for 14 days and then as needed 15 g 1   NON FORMULARY      rosuvastatin (CRESTOR) 20 mg tablet Take 1 tablet (20 mg total) by mouth daily. FOR CHOLESTEROL 90 tablet 3   vit C,E-Zn-coppr-lutein-zeaxan (PreserVision AREDS-2) 250-90-40-1 mg cap capsule Take 1 capsule by mouth 2 (two) times a day. 2 capsules     No current facility-administered medications for this visit.    Allergies: Prednisone   Social History: Patient  reports that she has never smoked. She has never been exposed to tobacco smoke. She has never used smokeless tobacco. She reports that she does not currently use drugs. She reports that she does not drink alcohol.   Family History: The patient's family history includes Colon cancer in her mother; Diabetes in her brother; Glaucoma in her sister; Hypertension in her father, mother, and sister; Macular degeneration in her mother.  Review of Systems: A complete review of systems was obtained including: Constitutional, Eyes, ENT, Cardiovascular, Respiratory, GI, GU, Musculoskeletal, Skin, Neurological, Psychiatric, Endocrine, Heme/Lymphatic, and Allergic/Immunologic systems. It is negative or non-contributory to the patients management except for as stated in this note.   Physical Exam: BP (!) 172/78 (BP Location: Right arm, Patient Position: Sitting)   Pulse 63   Temp 97.7 F (36.5 C) (Temporal)   Wt 83 kg (183 lb)   LMP 12/21/1983   BMI 35.74 kg/m  GENERAL APPEARANCE:  Well appearing, well developed, well nourished, NAD HEENT: Atraumatic, Normocephalic NECK: Supple without lymphadenopathy or masses. LUNGS: Clear to auscultation bilaterally, Normal  respiratory effort HEART: no obvious cyanosis ABDOMEN: Soft, non-tender, No Masses. EXTREMITIES: Moves all extremities well.  Without edema. NEUROLOGIC:  Alert and oriented x 3, CN II-XII grossly intact.  MENTAL STATUS:  Appropriate. BACK:  Non-tender to palpation.   No CVAT SKIN:  Warm, dry and intact.   GENT:  Results:   Urinalysis and laboratory results, if present, reviewed and analyzed. Recent Results (from the past 24 hours)  Urinalysis with Reflex to Microscopic   Collection Time: 12/26/24  2:46 PM  Result Value Ref Range   Color, Urine Yellow Yellow   Clarity, Urine Clear Clear   Specific Gravity, Urine 1.025 1.002 - 1.030   pH, Urine 5.5 5.0 - 8.0   Protein, Urine Negative Negative, Trace mg/dL   Glucose, Urine Negative Negative mg/dL   Ketones, Urine Negative Negative mg/dL   Bilirubin, Urine Negative Negative   Blood, Urine Negative Negative   Nitrite, Urine Negative Negative   Leukocyte Esterase, Urine Negative Negative   Urobilinogen, Urine 0.2 <2.0 mg/dL    I have discussed in detail the risks, benefits and alternatives of ureteroscopic stone extraction to include, but not limited to: bleeding, infection, ureteral perforation with need for open repair, inability to place stent necessitating the need for further procedures, possible percutaneous nephrostomy  tube placement, discomfort from the stents, hematuria, urgency, frequency and refractory problems after the stent is removed. I have discussed that the stent is not a permanent stent and will require a follow up for stent removal or stent exchange. The patient voices understanding of the risks and benefits of the procedure and consents to this procedure.

## 2024-12-21 ENCOUNTER — Telehealth (HOSPITAL_COMMUNITY): Payer: Self-pay | Admitting: *Deleted

## 2024-12-21 NOTE — Telephone Encounter (Signed)
 Left message on voicemail per DPR in reference to upcoming appointment scheduled on 12/27/2024 at 11:30 with detailed instructions given per Stress Test Requisition Sheet for the test. LM to arrive 30 minutes early, and that it is imperative to arrive on time for appointment to keep from having the test rescheduled. If you need to cancel or reschedule your appointment, please call the office within 24 hours of your appointment. Failure to do so may result in a cancellation of your appointment, and a $50 no show fee. Phone number given for call back for any questions. Burnetta Reena RAMAN

## 2024-12-25 ENCOUNTER — Ambulatory Visit (INDEPENDENT_AMBULATORY_CARE_PROVIDER_SITE_OTHER): Admitting: Psychiatry

## 2024-12-25 DIAGNOSIS — F4323 Adjustment disorder with mixed anxiety and depressed mood: Secondary | ICD-10-CM | POA: Diagnosis not present

## 2024-12-25 DIAGNOSIS — Z638 Other specified problems related to primary support group: Secondary | ICD-10-CM

## 2024-12-25 DIAGNOSIS — F341 Dysthymic disorder: Secondary | ICD-10-CM
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
## 2024-12-27 ENCOUNTER — Ambulatory Visit (HOSPITAL_COMMUNITY)
Admission: RE | Admit: 2024-12-27 | Discharge: 2024-12-27 | Disposition: A | Discharge status: Home / Self Care | Source: Ambulatory Visit | Attending: Cardiovascular Disease | Admitting: Cardiovascular Disease

## 2024-12-27 DIAGNOSIS — I421 Obstructive hypertrophic cardiomyopathy: Secondary | ICD-10-CM | POA: Insufficient documentation

## 2024-12-27 LAB — ECHOCARDIOGRAM STRESS TEST
Area-P 1/2: 2.26 cm2
S' Lateral: 2.85 cm

## 2024-12-28 ENCOUNTER — Ambulatory Visit: Payer: Self-pay | Admitting: Internal Medicine

## 2025-01-04 ENCOUNTER — Ambulatory Visit: Attending: Internal Medicine | Admitting: Internal Medicine

## 2025-01-04 VITALS — BP 126/79 | HR 80 | Ht 61.0 in | Wt 184.2 lb

## 2025-01-04 DIAGNOSIS — I421 Obstructive hypertrophic cardiomyopathy: Secondary | ICD-10-CM | POA: Insufficient documentation

## 2025-01-04 DIAGNOSIS — I10 Essential (primary) hypertension: Secondary | ICD-10-CM | POA: Diagnosis present

## 2025-01-04 DIAGNOSIS — N189 Chronic kidney disease, unspecified: Secondary | ICD-10-CM | POA: Insufficient documentation

## 2025-01-04 DIAGNOSIS — E782 Mixed hyperlipidemia: Secondary | ICD-10-CM | POA: Insufficient documentation

## 2025-01-04 DIAGNOSIS — E119 Type 2 diabetes mellitus without complications: Secondary | ICD-10-CM | POA: Diagnosis present

## 2025-01-04 MED ORDER — METOPROLOL SUCCINATE ER 25 MG PO TB24: 25.0000 mg | ORAL_TABLET | Freq: Every day | ORAL | 3 refills | Status: AC

## 2025-01-04 NOTE — Patient Instructions (Signed)
 Medication Instructions:  Your physician has recommended you make the following change in your medication:   INCREASE: metoprolol  succinate to 125 mg once daily. You will take metoprolol  100 mg and 25 mg to equal 125 mg once daily  *If you need a refill on your cardiac medications before your next appointment, please call your pharmacy*  Lab Work: NONE  If you have labs (blood work) drawn today and your tests are completely normal, you will receive your results only by: MyChart Message (if you have MyChart) OR A paper copy in the mail If you have any lab test that is abnormal or we need to change your treatment, we will call you to review the results.  Testing/Procedures: NONE   Follow-Up: At [facility], you and your health needs are our priority.  As part of our continuing mission to provide you with exceptional heart care, our providers are all part of one team.  This team includes your primary Cardiologist (physician) and Advanced Practice Providers or APPs (Physician Assistants and Nurse Practitioners) who all work together to provide you with the care you need, when you need it.  Your next appointment:   5 month(s)  Provider:   Stanly Leavens, MD     Other Instructions

## 2025-01-04 NOTE — Progress Notes (Signed)
 " Cardiology Office Note:  .    Date:  01/04/2025  ID:  Jenny Thompson, DOB 11/21/1946, MRN [id number] PCP: Norleen Nohemi Shuck, MD  [facility] Providers Cardiologist:  None     CC: oHCM  History of Present Illness: .    Jenny Thompson is a 79 y.o. female with obstructive hypertrophic cardiomyopathy who presents for follow-up regarding her cardiac condition.  She experiences fatigue during certain activities but generally feels well. She recently underwent a stress test, during which she did not notice shortness of breath, although it was observed by the staff. She attributes her focus on not falling off the treadmill as a reason for not noticing any breathlessness.  She underwent kidney stone surgery the day before the visit and is managing pain with a heating pad. Despite initial concerns about attending the appointment post-surgery, she feels she is recovering well.  She has a history of arthritis affecting her back, shoulders, neck, and feet. She clarifies that she has not broken her back, only her arm, which was a significant fracture. She experiences difficulty walking due to knee issues but notes improvement once she starts moving. Back pain limits her ability to walk far from home.  She is currently on metoprolol , which was adjusted previously, and her symptoms have not worsened since the change.  She acknowledges not exercising regularly and describes herself as 'lazy.' She wants to start exercising, mentioning that she has a recumbent bike at home, which she has used successfully in the past without causing knee pain.  Her cardiac history includes short runs of asymptomatic supraventricular tachycardia noted in March 2025. An echocardiogram from February 2025 showed no resting obstruction with a Valsalva gradient of 18 mmHg and mild mitral regurgitation. A cardiac MRI in August 2025 revealed a maximal septal thickness of 15 mm, mild mitral regurgitation with a regurgitant fraction  of 19%, and an abnormally thickened mitral valve with papillary muscle elongation. Her EKG shows signs of left ventricular hypertrophy without secondary repolarization changes.  Discussed the use of AI scribe software for clinical note transcription with the patient, who gave verbal consent to proceed.  Relevant histories: .  Social  - Father: deceased due to congestive heart failure - No family history of hypertrophic cardiomyopathy or sudden cardiac death.  Sister likely screened negative (echo) ROS: As per HPI.   Studies Reviewed: .     Cardiac Studies & Procedures   ______________________________________________________________________________________________   STRESS TESTS  ECHOCARDIOGRAM STRESS TEST 12/27/2024  Narrative EXERCISE STRESS ECHO REPORT   --------------------------------------------------------------------------------  Patient Name:   Jenny Thompson  Date of Exam: 12/27/2024 Medical Rec #:  [id number]     Height:       61.0 in Accession #:    [id number]    Weight:       186.2 lb Date of Birth:  August 07, 1946    BSA:          1.832 m Patient Age:    78 years      BP:           140/86 mmHg Patient Gender: F             HR:           65 bpm. Exam Location:  [address]  Procedure: Stress Echo  Indications:    I42.1 HOCM  History:        Patient has no prior history of Echocardiogram examinations.  Sonographer:    Elsie Bohr RDCS Referring Phys:  [id number] Jenny Thompson A Jeromiah Ohalloran   Sonographer Comments: *  FINDINGS  Exam Protocol: The patient exercised on a treadmill according to a Bruce protocol.   Patient Performance: The patient exercised for 5 minutes and 20 seconds, achieving 3.4 METS. The maximum stage achieved was II of the Bruce protocol. The baseline heart rate was 71 bpm. The heart rate at peak stress was 93 bpm. The target heart rate was calculated to be 120 bpm. The percentage of maximum predicted heart rate achieved was 66.1 %. The baseline  blood pressure was 148/83 mmHg. The blood pressure at peak stress was 171/94 mmHg. The patient developed shortness of breath and fatigue during the stress exam. The symptoms resolved with rest.  EKG: Resting EKG showed normal sinus rhythm with poor R wave progression. The patient developed no abnormal EKG findings during exercise.   2D Echo Findings: The baseline ejection fraction was 60%. Baseline regional wall motion abnormalities were not present. Patient with mild MAC. Moderate LVH with systolic anterior motion of the mitral valve at rest. Rest gradient of , increased to at peak. Aortic valve sclerosis   Jenny Thompson Electronically signed on 12/27/2024 at 4:09:55 PM      Final          CARDIAC MRI  MR CARDIAC MORPHOLOGY W WO CONTRAST 07/23/2024  Narrative CLINICAL DATA:  Hypertrophic cardiomyopathy  EXAM: CARDIAC MRI  TECHNIQUE: The patient was scanned on a 1.5 Tesla GE magnet. A dedicated cardiac coil was used. Functional imaging was done using Fiesta sequences. 2,3, and 4 chamber views were done to assess for RWMA's. Modified Simpson's rule using a short axis stack was used to calculate an ejection fraction on a dedicated work [employer]. The patient received 8 cc of Gadavist . After 10 minutes inversion recovery sequences were used to assess for infiltration and scar tissue.  FINDINGS: Limited images of the lung fields showed no gross abnormalities.  Normal left ventricular size with moderate asymmetric basal septal hypertrophy. 15 mm basal septum, 5 cm basal inferolateral wall. Normal wall motion with LV EF 62%. There was turbulent flow noted in the LV outflow tract. There was mitral valve systolic anterior motion. Mild to moderate mitral regurgitation by regurgitant fraction of 19%. Visually, the mitral regurgitation looked worse (at least moderate). Normal right ventricular size and systolic function, RV EF 52%. Mild left  atrial enlargement. Normal right atrium. The aortic valve was not well-visualized, but I am concerned for a degree of aortic valve stenosis and a possible functionally bicuspid aortic valve with fused raphe between the left and noncoronary cusps.  On delayed enhancement imaging, there was no myocardial late gadolinium enhancement (LGE).  MEASUREMENTS: MEASUREMENTS LVEDV 103 mL  LVEDVi 54 mL/m2  LVSV 64 mL LVEF 62%  RVEDV 113 mL  RVEDVi 59 mL/m2  RVSV 58 mL RVEF 52%  Aortic forward volume 52 mL  Aortic regurgitant fraction 0%  Global T1 1038, ECV 26%  Global T2 49 (within normal limits)  IMPRESSION: 1. Normal LV size with moderate asymmetric basal septal hypertrophy. Normal wall motion, LV EF 62%. Turbulent flow in LV outflow tract.  2.  Normal RV size and systolic function, RV EF 52%.  3. There was mitral valve systolic anterior motion with mild-moderate mitral regurgitation by regurgitant fraction though MR looked at least moderate visually.  4. The aortic valve was not well-visualized but looked functionally bicuspid and restricted.  5.  Normal extracellular volume percentage.  6.  No myocardial LGE was noted  on this study.  This study is suggestive of hypertrophic obstructive cardiomyopathy but with no delayed enhancement. Would review echo to assess LV outflow tract gradient and also for interrogation of aortic valve which appears to have a degree of stenosis that would be better assessed by echo. Patient may benefit from cardiac myosin inhibitor and referral to HCM clinic.  Jenny Thompson   Electronically Signed By: Jenny Thompson M.D. On: 07/23/2024 18:13   ______________________________________________________________________________________________       Physical Exam:    VS:  BP 126/79 (BP Location: Right Arm)   Pulse 80   Ht 5' 1 (1.549 m)   Wt 184 lb 3.2 oz (83.6 kg)   SpO2 95%   BMI 34.80 kg/m    Wt Readings from Last 3  Encounters:  01/04/25 184 lb 3.2 oz (83.6 kg)  10/17/24 186 lb 3.2 oz (84.5 kg)  05/23/24 185 lb 6.4 oz (84.1 kg)    Gen: no distress   Neck: No JVD Cardiac: No Rubs or Gallops, harsh systolic murmur, RRR +2 radial pulses Respiratory: Clear to auscultation bilaterally, normal effort, normal  respiratory rate GI: Soft, nontender, non-distended  MS: No  edema;  moves all extremities Integument: Skin feels warm Neuro:  At time of evaluation, alert and oriented to person/place/time/situation  Psych: Normal affect, patient feels ok  ASSESSMENT AND PLAN: .    Hypertrophic Cardiomyopathy  - Septal Variant - peak gradient 34 mm Hg on metoprolol  monotherapy  - with primary MR and unclear AV morphology  - suspicion of Fabry's/Danon/Noonan's or other mimics of HCM: low - Gene variant: Deferred - NYHA II - Treadmill 12/27/24 with no high risk features, non severe obstruction (34 mm Hg)  - Non HCM Contributors to disease/status Hypertension - resolved  Type 2 diabetes mellitus with stage 3 chronic kidney disease - Diabetes is well controlled with an A1c of 6.0. CKD stage 3 with creatinine of 1.56. - could consider GLP1-RA therapy in the future  Hyperlipidemia - Managed with rosuvastatin.   Family history with echos screening  SCD  Assessment - CMR from 2025 notable for no scar - 2 year assessment for VT on rhythm monitor showed no VT or NSVT - SCD risk estimated to be 1.07 at 5 years SDM: we have discussed Class III indication for ICD; will not pursue ICD at this time  Atrial fibrillation Assessment - HCM-AF score 23 - Atrial arrhythmia management: Asymptomatic SVT on 2025 heart monitor, may re-assess in 2026 or 2027 based on sx burden  Medication symptom plan - Obstructive hypertrophic cardiomyopathy Minimally symptomatic, non-severe obstructive hypertrophic cardiomyopathy with no evidence of atrial fibrillation or high-risk features on stress test. Low risk of sudden cardiac  death. Conservative management preferred due to mild symptoms and recent kidney stone surgery. - Increased metoprolol  to 125 mg daily. - Encouraged gradual increase in exercise, starting with 5 minutes daily, aiming for 150 minutes per week. - Discussed Mediterranean diet to improve symptoms. - Will consider cardiac myosin inhibitor therapy ([place]) if symptoms worsen or if she hits a wall with current management. - Will schedule follow-up in the summer unless symptoms worsen. - summer f/u unless she cannot do the   Longitudinal care: The evaluation and management services provided today reflect the complexity inherent in caring for this patient, including the ongoing longitudinal relationship and management of multiple chronic conditions and/or the need for care coordination. The visit required a comprehensive assessment and management plan tailored to the patient's unique needs Time was  spent addressing not only the acute concerns but also the broader context of the patient's health, including preventive care, chronic disease management, and care coordination as appropriate.  Complex longitudinal is necessary for conditions including: complex HCM  Stanly Leavens, MD FASE [facility] Cardiologist [facility]  [address] [place], [address] [place], [place] [address] [phone]  5:09 PM  "

## 2025-01-22 ENCOUNTER — Ambulatory Visit: Admitting: Psychiatry

## 2025-01-23 ENCOUNTER — Ambulatory Visit: Admitting: Internal Medicine

## 2025-02-12 ENCOUNTER — Ambulatory Visit: Admitting: Psychiatry

## 2025-03-12 ENCOUNTER — Ambulatory Visit: Admitting: Psychiatry

## 2025-04-09 ENCOUNTER — Ambulatory Visit: Admitting: Psychiatry
